# Patient Record
Sex: Female | Born: 1970
Health system: Southern US, Community
[De-identification: ages and names within clinical notes are randomized; demographics above are authoritative.]

## PROBLEM LIST (undated history)

## (undated) DIAGNOSIS — G459 Transient cerebral ischemic attack, unspecified: Secondary | ICD-10-CM

## (undated) DIAGNOSIS — I10 Essential (primary) hypertension: Secondary | ICD-10-CM

## (undated) DIAGNOSIS — D649 Anemia, unspecified: Secondary | ICD-10-CM

## (undated) DIAGNOSIS — N809 Endometriosis, unspecified: Secondary | ICD-10-CM

## (undated) DIAGNOSIS — I639 Cerebral infarction, unspecified: Secondary | ICD-10-CM

## (undated) DIAGNOSIS — G4730 Sleep apnea, unspecified: Secondary | ICD-10-CM

## (undated) HISTORY — PX: ABDOMINAL SURGERY: SHX537

## (undated) HISTORY — PX: BREAST BIOPSY: SHX20

## (undated) HISTORY — PX: TUBAL LIGATION: SHX77

## (undated) HISTORY — PX: BREAST EXCISIONAL BIOPSY: SUR124

---

## 1998-06-20 ENCOUNTER — Other Ambulatory Visit: Admission: RE | Admit: 1998-06-20 | Discharge: 1998-06-20 | Payer: Self-pay | Admitting: Internal Medicine

## 1999-09-05 ENCOUNTER — Encounter: Payer: Self-pay | Admitting: Emergency Medicine

## 1999-09-05 ENCOUNTER — Emergency Department (HOSPITAL_COMMUNITY): Admission: EM | Admit: 1999-09-05 | Discharge: 1999-09-05 | Payer: Self-pay | Admitting: Emergency Medicine

## 1999-11-22 ENCOUNTER — Ambulatory Visit (HOSPITAL_COMMUNITY): Admission: RE | Admit: 1999-11-22 | Discharge: 1999-11-22 | Payer: Self-pay | Admitting: Internal Medicine

## 1999-11-22 ENCOUNTER — Encounter: Payer: Self-pay | Admitting: Internal Medicine

## 2000-02-05 ENCOUNTER — Other Ambulatory Visit: Admission: RE | Admit: 2000-02-05 | Discharge: 2000-02-05 | Payer: Self-pay | Admitting: Internal Medicine

## 2000-12-11 ENCOUNTER — Inpatient Hospital Stay (HOSPITAL_COMMUNITY): Admission: AD | Admit: 2000-12-11 | Discharge: 2000-12-11 | Payer: Self-pay | Admitting: Obstetrics

## 2000-12-11 ENCOUNTER — Encounter: Payer: Self-pay | Admitting: Obstetrics

## 2000-12-14 ENCOUNTER — Inpatient Hospital Stay (HOSPITAL_COMMUNITY): Admission: AD | Admit: 2000-12-14 | Discharge: 2000-12-14 | Payer: Self-pay | Admitting: *Deleted

## 2000-12-17 ENCOUNTER — Ambulatory Visit (HOSPITAL_COMMUNITY): Admission: RE | Admit: 2000-12-17 | Discharge: 2000-12-17 | Payer: Self-pay | Admitting: Obstetrics and Gynecology

## 2000-12-17 ENCOUNTER — Encounter: Payer: Self-pay | Admitting: Obstetrics and Gynecology

## 2001-01-14 ENCOUNTER — Other Ambulatory Visit: Admission: RE | Admit: 2001-01-14 | Discharge: 2001-01-14 | Payer: Self-pay | Admitting: Obstetrics and Gynecology

## 2001-05-11 ENCOUNTER — Inpatient Hospital Stay (HOSPITAL_COMMUNITY): Admission: AD | Admit: 2001-05-11 | Discharge: 2001-05-11 | Payer: Self-pay | Admitting: Obstetrics and Gynecology

## 2001-05-15 ENCOUNTER — Encounter: Payer: Self-pay | Admitting: Obstetrics and Gynecology

## 2001-05-15 ENCOUNTER — Ambulatory Visit (HOSPITAL_COMMUNITY): Admission: RE | Admit: 2001-05-15 | Discharge: 2001-05-15 | Payer: Self-pay | Admitting: Obstetrics and Gynecology

## 2001-06-11 ENCOUNTER — Inpatient Hospital Stay (HOSPITAL_COMMUNITY): Admission: AD | Admit: 2001-06-11 | Discharge: 2001-06-11 | Payer: Self-pay | Admitting: Obstetrics and Gynecology

## 2001-06-12 ENCOUNTER — Inpatient Hospital Stay (HOSPITAL_COMMUNITY): Admission: AD | Admit: 2001-06-12 | Discharge: 2001-06-12 | Payer: Self-pay | Admitting: Obstetrics and Gynecology

## 2001-07-05 ENCOUNTER — Inpatient Hospital Stay (HOSPITAL_COMMUNITY): Admission: AD | Admit: 2001-07-05 | Discharge: 2001-07-05 | Payer: Self-pay | Admitting: Obstetrics and Gynecology

## 2001-08-10 ENCOUNTER — Inpatient Hospital Stay (HOSPITAL_COMMUNITY): Admission: AD | Admit: 2001-08-10 | Discharge: 2001-08-10 | Payer: Self-pay | Admitting: Obstetrics and Gynecology

## 2001-08-12 ENCOUNTER — Inpatient Hospital Stay (HOSPITAL_COMMUNITY): Admission: AD | Admit: 2001-08-12 | Discharge: 2001-08-14 | Payer: Self-pay | Admitting: Obstetrics and Gynecology

## 2001-10-30 ENCOUNTER — Inpatient Hospital Stay (HOSPITAL_COMMUNITY): Admission: AD | Admit: 2001-10-30 | Discharge: 2001-10-30 | Payer: Self-pay | Admitting: Obstetrics and Gynecology

## 2002-03-09 ENCOUNTER — Other Ambulatory Visit: Admission: RE | Admit: 2002-03-09 | Discharge: 2002-03-09 | Payer: Self-pay | Admitting: Obstetrics and Gynecology

## 2003-05-14 ENCOUNTER — Ambulatory Visit (HOSPITAL_BASED_OUTPATIENT_CLINIC_OR_DEPARTMENT_OTHER): Admission: RE | Admit: 2003-05-14 | Discharge: 2003-05-14 | Payer: Self-pay | Admitting: Urology

## 2003-11-24 ENCOUNTER — Other Ambulatory Visit: Admission: RE | Admit: 2003-11-24 | Discharge: 2003-11-24 | Payer: Self-pay | Admitting: Obstetrics and Gynecology

## 2005-06-05 ENCOUNTER — Other Ambulatory Visit: Admission: RE | Admit: 2005-06-05 | Discharge: 2005-06-05 | Payer: Self-pay | Admitting: Obstetrics and Gynecology

## 2005-06-21 ENCOUNTER — Encounter: Admission: RE | Admit: 2005-06-21 | Discharge: 2005-06-21 | Payer: Self-pay | Admitting: Family Medicine

## 2005-08-12 ENCOUNTER — Encounter: Admission: RE | Admit: 2005-08-12 | Discharge: 2005-08-12 | Payer: Self-pay | Admitting: Family Medicine

## 2005-10-25 ENCOUNTER — Emergency Department (HOSPITAL_COMMUNITY): Admission: EM | Admit: 2005-10-25 | Discharge: 2005-10-25 | Payer: Self-pay | Admitting: Emergency Medicine

## 2006-02-05 ENCOUNTER — Other Ambulatory Visit: Admission: RE | Admit: 2006-02-05 | Discharge: 2006-02-05 | Payer: Self-pay | Admitting: Obstetrics and Gynecology

## 2006-06-03 ENCOUNTER — Ambulatory Visit (HOSPITAL_COMMUNITY): Admission: RE | Admit: 2006-06-03 | Discharge: 2006-06-03 | Payer: Self-pay | Admitting: Gastroenterology

## 2007-06-25 ENCOUNTER — Ambulatory Visit (HOSPITAL_COMMUNITY): Admission: RE | Admit: 2007-06-25 | Discharge: 2007-06-25 | Payer: Self-pay | Admitting: Obstetrics and Gynecology

## 2008-08-26 ENCOUNTER — Encounter: Admission: RE | Admit: 2008-08-26 | Discharge: 2008-08-26 | Payer: Self-pay | Admitting: Family Medicine

## 2008-12-14 ENCOUNTER — Inpatient Hospital Stay (HOSPITAL_COMMUNITY): Admission: AD | Admit: 2008-12-14 | Discharge: 2008-12-14 | Payer: Self-pay | Admitting: Obstetrics and Gynecology

## 2009-05-08 ENCOUNTER — Inpatient Hospital Stay (HOSPITAL_COMMUNITY): Admission: AD | Admit: 2009-05-08 | Discharge: 2009-05-08 | Payer: Self-pay | Admitting: Obstetrics and Gynecology

## 2009-05-08 ENCOUNTER — Inpatient Hospital Stay (HOSPITAL_COMMUNITY): Admission: AD | Admit: 2009-05-08 | Discharge: 2009-05-10 | Payer: Self-pay | Admitting: Obstetrics and Gynecology

## 2010-03-21 ENCOUNTER — Ambulatory Visit (HOSPITAL_COMMUNITY)
Admission: RE | Admit: 2010-03-21 | Discharge: 2010-03-21 | Payer: Self-pay | Source: Home / Self Care | Attending: Obstetrics and Gynecology | Admitting: Obstetrics and Gynecology

## 2010-04-22 ENCOUNTER — Encounter: Payer: Self-pay | Admitting: Obstetrics and Gynecology

## 2010-06-12 LAB — BASIC METABOLIC PANEL
CO2: 24 mEq/L (ref 19–32)
Chloride: 107 mEq/L (ref 96–112)
GFR calc Af Amer: >60 mL/min (ref >60)
Potassium: 4 mEq/L (ref 3.5–5.1)
Sodium: 137 mEq/L (ref 135–145)

## 2010-06-12 LAB — CBC
Hemoglobin: 12.9 g/dL (ref 12.0–15.0)
MCH: 27 pg (ref 26.0–34.0)
MCV: 78.6 fL (ref 78.0–100.0)

## 2010-06-12 LAB — SURGICAL PCR SCREEN: Staphylococcus aureus: NEGATIVE

## 2010-06-21 LAB — CBC
HCT: 37.1 % (ref 36.0–46.0)
HCT: 37.7 % (ref 36.0–46.0)
Hemoglobin: 12.6 g/dL (ref 12.0–15.0)
MCHC: 32.9 g/dL (ref 30.0–36.0)
MCHC: 33.4 g/dL (ref 30.0–36.0)
MCV: 82.9 fL (ref 78.0–100.0)
Platelets: 176 10*3/uL (ref 150–400)
RDW: 16.5 % — ABNORMAL HIGH (ref 11.5–15.5)

## 2010-06-21 LAB — RPR: RPR Ser Ql: NONREACTIVE

## 2010-07-07 LAB — WET PREP, GENITAL
Clue Cells Wet Prep HPF POC: NONE SEEN
Trich, Wet Prep: NONE SEEN
Yeast Wet Prep HPF POC: NONE SEEN

## 2010-07-07 LAB — URINALYSIS, ROUTINE W REFLEX MICROSCOPIC
Bilirubin Urine: NEGATIVE
Glucose, UA: NEGATIVE mg/dL
Hgb urine dipstick: NEGATIVE
Urobilinogen, UA: 0.2 mg/dL (ref 0.0–1.0)

## 2010-08-15 NOTE — Op Note (Signed)
[name], [name] NO.:  [id number]   MEDICAL RECORD NO.:  [id number]          PATIENT TYPE:  AMB   LOCATION:  SDC                           FACILITY:  [facility]   PHYSICIAN:  Miguel Aschoff, M.D.       DATE OF BIRTH:  1970-09-08   DATE OF PROCEDURE:  06/25/2007  DATE OF DISCHARGE:                               OPERATIVE REPORT   PREOPERATIVE DIAGNOSIS:  Pelvic pain.   Dictation ended at this point.      Miguel Aschoff, M.D.  Electronically Signed     AR/MEDQ  D:  06/25/2007  T:  06/25/2007  Job:  [id number]

## 2010-08-15 NOTE — Op Note (Signed)
NAMECONCEPTION, DOEBLER NO.:  [id number]   MEDICAL RECORD NO.:  [id number]          PATIENT TYPE:  AMB   LOCATION:  SDC                           FACILITY:  [facility]   PHYSICIAN:  Miguel Aschoff, M.D.       DATE OF BIRTH:  [birth year]   DATE OF PROCEDURE:  06/25/2007  DATE OF DISCHARGE:                               OPERATIVE REPORT   PREOPERATIVE DIAGNOSIS:  Chronic pelvic pain.   POSTOPERATIVE DIAGNOSIS:  Pelvic adhesions, pelvic endometriosis.   SURGEON:  Miguel Aschoff, MD   ASSISTANT:  Luvenia Redden, MD   ANESTHESIA:  General.   COMPLICATIONS:  None.   JUSTIFICATION:  The patient is a 40 year old black female with a history  of pelvic pain, more noticeable on the right.  The patient has undergone  medical evaluation and treatment with no improvement of the pain.  She  presents now to undergo evaluation with laparoscopy to see if the source  of the pain can be established and corrected.  The risks and benefits of  the procedure were discussed with the patient.  Informed consent has  been obtained.   PROCEDURE IN DETAIL:  The patient was taken to the operating room and  placed in the supine position.  General anesthesia was administered  without difficulty.  She was then placed in the dorsal lithotomy  position, prepped and draped in the usual sterile fashion.  Once this  was done the bladder was catheterized.  Speculum was placed in the  vaginal vault.  Hulka tenaculum was placed through the cervix and then  attention was directed to the umbilicus where a small infraumbilical  incision was made.  A Veress needle was inserted and then the abdomen  was insufflated with 3 liters of CO2.  Following the insufflation the  trocar laparoscope was placed followed by the laparoscope itself.  At  this point two 5 mm ports were established in the right and left lower  quadrants and then systematic inspection of pelvic organs was carried  out.  The anterior peritoneum was  unremarkable.  The round ligaments  were inspected and traced out to their exits from the abdomen.  There  were no signs of any inguinal hernias.  The right tube was visualized  along its course and was completely normal.  There were no peritubal  adhesions.  The right ovary appeared to be completely within normal  limits, again without adhesions.  On the left side the left tube was  completely within normal limits.  However, the left ovary was noted to  be bound to the appendices epiploica of the sigmoid colon.  In the cul-  de-sac nonpigmented lesions were noted between the right and left  uterosacral ligaments which appeared to be consistent with nonpigmented  endometriosis.  The intestinal surfaces were inspected and were noted to  be within normal limits.  The appendix was visualized and was also noted  to be within normal limits.  The liver was inspected and no  abnormalities were noted on the surface of the liver.   At this point using laparoscopic  scissors the adhesions holding the left  ovary to the appendices epiploica were taken down sharply and any  bleeding points cauterized with excellent hemostasis.  It was possible  to free the entire posterior surface of the ovary off the appendices  epiploica and restore the anatomy to a more normal configuration.  Attention was then directed to the implants noted in the cul-de-sac.  These were touched with bipolar cautery to ablate these implants.  With  no other abnormalities being noted it was elected to complete the  procedure.  The area where the adhesions were taken off the left ovary  was then covered in Interceed in an effort to prevent reformation of  these adhesions.  Once this was done the CO2 was allowed to escape.  All  instruments were removed and small incisions were closed using  subcuticular 4-0 Vicryl.  Port sites were then injected with a total of  10 mL of quarter percent Marcaine.  Blood loss was minimal.  At this   point all instruments were removed.  The patient was taken out of the  lithotomy position and then reversed from the anesthetic and brought to  the recovery room in satisfactory condition.   PLAN:  Is for the patient to be discharged home.   HOME MEDICATIONS:  1. Include Tylox 1-3 hours as needed for pain.  2. Doxycycline one twice a day x3 days.   FOLLOWUP:  The patient will be seen back in 4 weeks for followup  examination.  She is to call if there are any problems such as fever,  pain or heavy bleeding.  She is to call on March 27 to discuss the  intraoperative findings.      Miguel Aschoff, M.D.  Electronically Signed     AR/MEDQ  D:  06/25/2007  T:  06/25/2007  Job:  [id number]
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
## 2010-08-18 NOTE — Op Note (Signed)
NAME:  Amy [name], Amy [name]                        ACCOUNT NO.:  [id number]   MEDICAL RECORD NO.:  [id number]                   PATIENT TYPE:  AMB   LOCATION:  NESC                                 FACILITY:  [facility]   PHYSICIAN:  Lindaann Slough, M.D.               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   POSTOPERATIVE DIAGNOSES:  Meatal stenosis and interstitial cystitis.   PROCEDURE:  Cystoscopy, hydraulic bladder distention and urethral dilation.   SURGEON:  Lindaann Slough, M.D.   ANESTHESIA:  General.   INDICATIONS FOR PROCEDURE:  The patient is a 40 year old female who has been  complaining of frequency, nocturia x 3-4.  She was treated with Bactrim and  urelle. She also has a history of recurrent urinary tract infections and she  continues to complain of the same symptoms. She is scheduled today for  cystoscopy.   Under general anesthesia, the patient was prepped and draped and placed in  the dorsal lithotomy position.  A #23 Wappler cystoscope could not be passed  in the bladder because of meatal stenosis.  The urethra was then dilated  with female sounds up to #30 [place]. Then the cystoscope was passed in the  bladder.  The bladder mucosa is normal, there is no stone or tumor in the  bladder.  The ureteral orifices are in normal position and shape with clear  efflux.  The bladder was then emptied and there was evidence of submucosal  hemorrhage after filling up the bladder again.  The bladder capacity is  about 600 mL.  Then a mixture of 15 mL of 0.5% Marcaine and 400 mg of  Pyridium was instilled in the bladder.   The cystoscope was then removed.  The patient tolerated the procedure well  and left the OR in satisfactory condition to post anesthesia care unit.                                               Lindaann Slough,  M.D.    MN/MEDQ  D:  05/14/2003  T:  05/14/2003  Job:  [id number]   cc:   Naima A. Normand Sloop, M.D.  [address], [address]  [place]  [place] [address]  Fax: [phone]

## 2010-12-25 LAB — PROTIME-INR
INR: 1.1
Prothrombin Time: 14.1

## 2010-12-25 LAB — CBC
Platelets: 370
RBC: 4.7
RDW: 14
WBC: 6.1

## 2010-12-25 LAB — APTT: aPTT: 33

## 2010-12-25 LAB — URINALYSIS, ROUTINE W REFLEX MICROSCOPIC
Glucose, UA: NEGATIVE
Hgb urine dipstick: NEGATIVE
Nitrite: NEGATIVE
Specific Gravity, Urine: 1.02

## 2010-12-25 LAB — PREGNANCY, URINE: Preg Test, Ur: NEGATIVE

## 2012-08-08 ENCOUNTER — Encounter (HOSPITAL_BASED_OUTPATIENT_CLINIC_OR_DEPARTMENT_OTHER): Payer: Self-pay | Admitting: *Deleted

## 2012-08-08 ENCOUNTER — Emergency Department (HOSPITAL_BASED_OUTPATIENT_CLINIC_OR_DEPARTMENT_OTHER): Payer: Self-pay

## 2012-08-08 ENCOUNTER — Emergency Department (HOSPITAL_BASED_OUTPATIENT_CLINIC_OR_DEPARTMENT_OTHER)
Admission: EM | Admit: 2012-08-08 | Discharge: 2012-08-08 | Disposition: A | Discharge status: Other Acute Inpatient Hospital | Payer: Self-pay | Attending: Emergency Medicine | Admitting: Emergency Medicine

## 2012-08-08 ENCOUNTER — Other Ambulatory Visit: Payer: Self-pay

## 2012-08-08 DIAGNOSIS — I10 Essential (primary) hypertension: Secondary | ICD-10-CM | POA: Insufficient documentation

## 2012-08-08 DIAGNOSIS — R5381 Other malaise: Secondary | ICD-10-CM | POA: Insufficient documentation

## 2012-08-08 DIAGNOSIS — M79609 Pain in unspecified limb: Secondary | ICD-10-CM | POA: Insufficient documentation

## 2012-08-08 DIAGNOSIS — Z8742 Personal history of other diseases of the female genital tract: Secondary | ICD-10-CM | POA: Insufficient documentation

## 2012-08-08 DIAGNOSIS — M25559 Pain in unspecified hip: Secondary | ICD-10-CM | POA: Insufficient documentation

## 2012-08-08 DIAGNOSIS — R079 Chest pain, unspecified: Secondary | ICD-10-CM | POA: Insufficient documentation

## 2012-08-08 DIAGNOSIS — M25519 Pain in unspecified shoulder: Secondary | ICD-10-CM | POA: Insufficient documentation

## 2012-08-08 HISTORY — DX: Endometriosis, unspecified: N80.9

## 2012-08-08 LAB — CBC WITH DIFFERENTIAL/PLATELET
Eosinophils Absolute: 0.5 10*3/uL (ref 0.0–0.7)
Eosinophils Relative: 6 % — ABNORMAL HIGH (ref 0–5)
Lymphs Abs: 2.8 10*3/uL (ref 0.7–4.0)
MCH: 26.1 pg (ref 26.0–34.0)
MCHC: 34.7 g/dL (ref 30.0–36.0)
MCV: 75.2 fL — ABNORMAL LOW (ref 78.0–100.0)
Monocytes Absolute: 0.5 10*3/uL (ref 0.1–1.0)
Neutrophils Relative %: 52 % (ref 43–77)
Platelets: 236 10*3/uL (ref 150–400)
RBC: 4.83 MIL/uL (ref 3.87–5.11)
RDW: 14.5 % (ref 11.5–15.5)

## 2012-08-08 LAB — BASIC METABOLIC PANEL
Calcium: 9.4 mg/dL (ref 8.4–10.5)
GFR calc Af Amer: >90 mL/min (ref >90)
GFR calc non Af Amer: 79 mL/min — ABNORMAL LOW (ref >90)
Glucose, Bld: 86 mg/dL (ref 70–99)
Sodium: 138 mEq/L (ref 135–145)

## 2012-08-08 LAB — TROPONIN I: Troponin I: <0.3 ng/mL (ref <0.30)

## 2012-08-08 MED ORDER — ASPIRIN 81 MG PO CHEW
324.0000 mg | CHEWABLE_TABLET | Freq: Once | ORAL | Status: AC
  Administered 2012-08-08: 324 mg via ORAL
  Filled 2012-08-08: qty 4

## 2012-08-08 MED ORDER — NITROGLYCERIN 2 % TD OINT
1.0000 [in_us] | TOPICAL_OINTMENT | Freq: Once | TRANSDERMAL | Status: AC
  Administered 2012-08-08: 1 [in_us] via TOPICAL
  Filled 2012-08-08: qty 1

## 2012-08-08 MED ORDER — NITROGLYCERIN 0.4 MG SL SUBL
0.4000 mg | SUBLINGUAL_TABLET | SUBLINGUAL | Status: DC | PRN
  Filled 2012-08-08: qty 25

## 2012-08-08 MED ORDER — ACETAMINOPHEN 325 MG PO TABS
650.0000 mg | ORAL_TABLET | Freq: Once | ORAL | Status: AC
  Administered 2012-08-08: 650 mg via ORAL
  Filled 2012-08-08: qty 2

## 2012-08-08 NOTE — ED Provider Notes (Signed)
History     CSN: [id number]  Arrival date & time 08/08/12  1919   First MD Initiated Contact with Patient 08/08/12 1933      Chief Complaint  Patient presents with  . Numbness    (Consider location/radiation/quality/duration/timing/severity/associated sxs/prior treatment) HPI Comments: Patient presents to the ER with complaints of elevated blood pressure and left arm pain. Patient reports that she has not been feeling like herself all day today. She has been having a heaviness across the shoulders and the pain in her left shoulder and arm. She reports some tingling in the left fingertips, but that has been present for more than a week. She had her blood pressure checked and it was 160/100. She reports that she has never had elevated blood pressure. Patient is not experiencing any shortness of breath.  She does report that when she woke this morning she had some pain and numbness in the right hip and leg region.   Past Medical History  Diagnosis Date  . Endometriosis     Past Surgical History  Procedure Laterality Date  . Tubal ligation      History reviewed. No pertinent family history.  History  Substance Use Topics  . Smoking status: Never Smoker   . Smokeless tobacco: Not on file  . Alcohol Use: No    OB History   Grav Para Term Preterm Abortions TAB SAB Ect Mult Living                  Review of Systems  Constitutional: Positive for fatigue.  Respiratory: Negative for shortness of breath.   Cardiovascular: Positive for chest pain.  All other systems reviewed and are negative.    Allergies  Codeine and Shellfish allergy  Home Medications   Current Outpatient Rx  Name  Route  Sig  Dispense  Refill  . cetirizine (ZYRTEC) 10 MG tablet   Oral   Take 10 mg by mouth daily.           BP 188/104  Pulse 85  Temp(Src) 99.6 F (37.6 C)  Resp 16  Ht 5\' 4"  (1.626 m)  Wt 193 lb (87.544 kg)  BMI 33.11 kg/m2  SpO2 100%  LMP 07/29/2012  Physical Exam   Constitutional: She is oriented to person, place, and time. She appears well-developed and well-nourished. No distress.  HENT:  Head: Normocephalic and atraumatic.  Right Ear: Hearing normal.  Left Ear: Hearing normal.  Nose: Nose normal.  Mouth/Throat: Oropharynx is clear and moist and mucous membranes are normal.  Eyes: Conjunctivae and EOM are normal. Pupils are equal, round, and reactive to light.  Neck: Normal range of motion. Neck supple.  Cardiovascular: Regular rhythm, S1 normal and S2 normal.  Exam reveals no gallop and no friction rub.   No murmur heard. Pulmonary/Chest: Effort normal and breath sounds normal. No respiratory distress. She exhibits no tenderness.  Abdominal: Soft. Normal appearance and bowel sounds are normal. There is no hepatosplenomegaly. There is no tenderness. There is no rebound, no guarding, no tenderness at McBurney's point and negative Murphy's sign. No hernia.  Musculoskeletal: Normal range of motion.  Neurological: She is alert and oriented to person, place, and time. She has normal strength. No cranial nerve deficit or sensory deficit. Coordination normal. GCS eye subscore is 4. GCS verbal subscore is 5. GCS motor subscore is 6.  Skin: Skin is warm, dry and intact. No rash noted. No cyanosis.  Psychiatric: She has a normal mood and affect. Her speech is  normal and behavior is normal. Thought content normal.    ED Course  Procedures (including critical care time)  EKG:  Date: 08/08/2012  Rate: 82  Rhythm: normal sinus rhythm  QRS Axis: normal  Intervals: normal  ST/T Wave abnormalities: nonspecific ST/T changes  Conduction Disutrbances:none  Narrative Interpretation:   Old EKG Reviewed: none available    Labs Reviewed  CBC WITH DIFFERENTIAL - Abnormal; Notable for the following:    MCV 75.2 (*)    Eosinophils Relative 6 (*)    All other components within normal limits  BASIC METABOLIC PANEL - Abnormal; Notable for the following:     Potassium 3.3 (*)    GFR calc non Af Amer 79 (*)    All other components within normal limits  PRO B NATRIURETIC PEPTIDE  TROPONIN I   Dg Chest 2 View  08/08/2012  *RADIOLOGY REPORT*  Clinical Data: Chest pain  CHEST - 2 VIEW  Comparison: None  Findings: The cardiomediastinal silhouette is unremarkable. There is no evidence of focal airspace disease, pulmonary edema, suspicious pulmonary nodule/mass, pleural effusion, or pneumothorax. No acute bony abnormalities are identified.  IMPRESSION: No evidence of active cardiopulmonary disease.   Original Report Authenticated By: Harmon Pier, M.D.    Ct Head Wo Contrast  08/08/2012  *RADIOLOGY REPORT*  Clinical Data: Left hand numbness  CT HEAD WITHOUT CONTRAST  Technique:  Contiguous axial images were obtained from the base of the skull through the vertex without contrast.  Comparison: 08/26/2008  Findings: Evidence of left antrectomy with left maxillary wall sclerosis and mucoperiosteal thickening.  Ethmoid sinusitis also noted.  No skull fracture. No acute hemorrhage, acute infarction, or mass lesion is seen.  IMPRESSION: No acute intracranial finding.   Original Report Authenticated By: Christiana Pellant, M.D.      Diagnosis: 1. Uncontrolled hypertension 2. Chest pain    MDM  She comes to the ER for evaluation of elevated blood pressure. Patient has been having some pain across her shoulder base and a heaviness in the left shoulder region as well. This started earlier today she noticed she was very hypertensive, came to the ER. Patient has blood pressure 186/111 on arrival. She has no previous history of hypertension. EKG has nonspecific changes with some subtle T wave inversions and possible depressions in lead 3 and aVF. Patient's pain has resolved and her blood pressure is much improved after nitroglycerin.  Patient's BNP troponin were normal. The remainder of her workup was essentially normal except for slight hypokalemia. X-ray did not show any  abnormality. Because the patient has had left arm numbness and tingling and also had some tingling in her right leg, head CT was performed. No abnormalities were seen. This will require further workup.  I recommended observation in the hospital overnight for control of the blood pressure and serial troponins. Patient is in agreement with the plan. She would like to go to [facility]. As with Dr. Blinda Leatherwood, on call for hospitalist service. He is accepting the patient.        Gilda Crease, MD 08/08/12 [phone]

## 2012-08-08 NOTE — ED Notes (Signed)
Patient transported to X-ray 

## 2012-08-08 NOTE — ED Notes (Signed)
Assigned to bed 702 @ [facility] per nursing supervisor Sunday Shams, RN notified, [facility] called for transport.

## 2012-08-08 NOTE — ED Notes (Signed)
[facility] has been notified. 

## 2012-08-08 NOTE — ED Notes (Signed)
Pt reports left arm and right leg  numbness x 7 hrs ago with increased bp

## 2013-04-19 ENCOUNTER — Encounter (HOSPITAL_COMMUNITY): Payer: Self-pay | Admitting: Emergency Medicine

## 2013-04-19 ENCOUNTER — Emergency Department (HOSPITAL_COMMUNITY)
Admission: EM | Admit: 2013-04-19 | Discharge: 2013-04-19 | Disposition: A | Discharge status: Home / Self Care | Payer: BC Managed Care – PPO | Attending: Emergency Medicine | Admitting: Emergency Medicine

## 2013-04-19 DIAGNOSIS — R42 Dizziness and giddiness: Secondary | ICD-10-CM

## 2013-04-19 DIAGNOSIS — Z79899 Other long term (current) drug therapy: Secondary | ICD-10-CM | POA: Insufficient documentation

## 2013-04-19 DIAGNOSIS — Z8742 Personal history of other diseases of the female genital tract: Secondary | ICD-10-CM | POA: Insufficient documentation

## 2013-04-19 DIAGNOSIS — H9209 Otalgia, unspecified ear: Secondary | ICD-10-CM | POA: Insufficient documentation

## 2013-04-19 LAB — POCT I-STAT, CHEM 8
BUN: 7 mg/dL (ref 6–23)
CHLORIDE: 100 meq/L (ref 96–112)
Calcium, Ion: 1.18 mmol/L (ref 1.12–1.23)
Creatinine, Ser: 1 mg/dL (ref 0.50–1.10)
GLUCOSE: 100 mg/dL — AB (ref 70–99)
HEMATOCRIT: 41 % (ref 36.0–46.0)
Hemoglobin: 13.9 g/dL (ref 12.0–15.0)
POTASSIUM: 3.6 meq/L — AB (ref 3.7–5.3)
SODIUM: 140 meq/L (ref 137–147)
TCO2: 27 mmol/L (ref 0–100)

## 2013-04-19 MED ORDER — SODIUM CHLORIDE 0.9 % IV BOLUS (SEPSIS)
1000.0000 mL | Freq: Once | INTRAVENOUS | Status: AC
  Administered 2013-04-19: 1000 mL via INTRAVENOUS

## 2013-04-19 MED ORDER — MECLIZINE HCL 25 MG PO TABS: 25.0000 mg | ORAL_TABLET | Freq: Three times a day (TID) | ORAL | Status: DC | PRN

## 2013-04-19 MED ORDER — ONDANSETRON HCL 4 MG/2ML IJ SOLN
4.0000 mg | Freq: Once | INTRAMUSCULAR | Status: AC
  Administered 2013-04-19: 4 mg via INTRAVENOUS
  Filled 2013-04-19: qty 2

## 2013-04-19 MED ORDER — MECLIZINE HCL 25 MG PO TABS
25.0000 mg | ORAL_TABLET | Freq: Once | ORAL | Status: AC
  Administered 2013-04-19: 25 mg via ORAL
  Filled 2013-04-19: qty 1

## 2013-04-19 NOTE — ED Provider Notes (Signed)
CSN: [id number]     Arrival date & time 04/19/13  1303 History   First MD Initiated Contact with Patient 04/19/13 1516     Chief Complaint  Patient presents with  . Nausea  . Emesis   (Consider location/radiation/quality/duration/timing/severity/associated sxs/prior Treatment) HPI Comments: Patient is a 43 year old female with history of hypertension who presents today for nausea, vomiting, and dizziness. This began yesterday, but worsened today when she woke up. She feels as though the room is spinning around her. Any movement she makes makes this sensation worse. Laying back in the bed with her eyes closed alleviates her symptoms. She has never had these symptoms in the past. Last week she had her BP meds adjusted. Her lisinopril dose was increased and HCTZ was added to her regimen. She denies fever, chills, abdominal pain, double vision, blurry vision, photophobia.   Patient is a 43 y.o. female presenting with vomiting. The history is provided by the patient. No language interpreter was used.  Emesis Associated symptoms: no abdominal pain and no chills     Past Medical History  Diagnosis Date  . Endometriosis    Past Surgical History  Procedure Laterality Date  . Tubal ligation     History reviewed. No pertinent family history. History  Substance Use Topics  . Smoking status: Never Smoker   . Smokeless tobacco: Not on file  . Alcohol Use: No   OB History   Grav Para Term Preterm Abortions TAB SAB Ect Mult Living                 Review of Systems  Constitutional: Negative for fever and chills.  HENT: Positive for ear pain.   Respiratory: Negative for shortness of breath.   Cardiovascular: Negative for chest pain.  Gastrointestinal: Positive for nausea and vomiting. Negative for abdominal pain.  Neurological: Positive for dizziness.  All other systems reviewed and are negative.    Allergies  Codeine; Iodine; and Shellfish allergy  Home Medications   Current  Outpatient Rx  Name  Route  Sig  Dispense  Refill  . lisinopril-hydrochlorothiazide (PRINZIDE,ZESTORETIC) 20-12.5 MG per tablet   Oral   Take 1 tablet by mouth daily.          BP 122/74  Pulse 84  Temp(Src) 97.3 F (36.3 C) (Oral)  Resp 16  SpO2 100% Physical Exam  Nursing note and vitals reviewed. Constitutional: She is oriented to person, place, and time. She appears well-developed and well-nourished. She does not appear ill. No distress.  Pt clearly uncomfortable changing positions  HENT:  Head: Normocephalic and atraumatic.  Right Ear: External ear and ear canal normal. No mastoid tenderness. Tympanic membrane is retracted.  Left Ear: Tympanic membrane, external ear and ear canal normal. No mastoid tenderness. Tympanic membrane is not retracted.  Nose: Nose normal.  Mouth/Throat: Uvula is midline and oropharynx is clear and moist. Mucous membranes are dry.  Eyes: Conjunctivae and EOM are normal. Pupils are equal, round, and reactive to light.  Neck: Normal range of motion. No spinous process tenderness and no muscular tenderness present.  Cardiovascular: Normal rate, regular rhythm, normal heart sounds, intact distal pulses and normal pulses.   Pulmonary/Chest: Effort normal and breath sounds normal. No stridor. No respiratory distress. She has no wheezes. She has no rales.  Abdominal: Soft. She exhibits no distension. There is no tenderness.  Musculoskeletal: Normal range of motion.  Neurological: She is alert and oriented to person, place, and time. She has normal strength. No  sensory deficit.  Finger nose finger normal. No pronator drift. Grip strength 5/5 bilaterally   Skin: Skin is warm and dry. She is not diaphoretic. No erythema.  Psychiatric: She has a normal mood and affect. Her behavior is normal.    ED Course  Procedures (including critical care time) Labs Review Labs Reviewed  POCT I-STAT, CHEM 8 - Abnormal; Notable for the following:    Potassium 3.6 (*)     Glucose, Bld 100 (*)    All other components within normal limits   Imaging Review No results found.  EKG Interpretation   None       MDM   1. Vertigo    Patient presents to ED with sx consistent with vertigo. Pt feels significantly improved after fluids and antivert. Neuro exam without focal deficit. Gait is not ataxic. Labs are unremarkable. Patient will follow up with her PCP. Reasons to return to the emergency department immediately were given. Vital signs stable for discharge. Patient / Family / Caregiver informed of clinical course, understand medical decision-making process, and agree with plan.     Mora Bellman, PA-C 04/20/13 1904

## 2013-04-19 NOTE — Discharge Instructions (Signed)
Benign Positional Vertigo °Vertigo means you feel like you or your surroundings are moving when they are not. Benign positional vertigo is the most common form of vertigo. Benign means that the cause of your condition is not serious. Benign positional vertigo is more common in older adults. °CAUSES  °Benign positional vertigo is the result of an upset in the labyrinth system. This is an area in the middle ear that helps control your balance. This may be caused by a viral infection, head injury, or repetitive motion. However, often no specific cause is found. °SYMPTOMS  °Symptoms of benign positional vertigo occur when you move your head or eyes in different directions. Some of the symptoms may include: °· Loss of balance and falls. °· Vomiting. °· Blurred vision. °· Dizziness. °· Nausea. °· Involuntary eye movements (nystagmus). °DIAGNOSIS  °Benign positional vertigo is usually diagnosed by physical exam. If the specific cause of your benign positional vertigo is unknown, your caregiver may perform imaging tests, such as magnetic resonance imaging (MRI) or computed tomography (CT). °TREATMENT  °Your caregiver may recommend movements or procedures to correct the benign positional vertigo. Medicines such as meclizine, benzodiazepines, and medicines for nausea may be used to treat your symptoms. In rare cases, if your symptoms are caused by certain conditions that affect the inner ear, you may need surgery. °HOME CARE INSTRUCTIONS  °· Follow your caregiver's instructions. °· Move slowly. Do not make sudden body or head movements. °· Avoid driving. °· Avoid operating heavy machinery. °· Avoid performing any tasks that would be dangerous to you or others during a vertigo episode. °· Drink enough fluids to keep your urine clear or pale yellow. °SEEK IMMEDIATE MEDICAL CARE IF:  °· You develop problems with walking, weakness, numbness, or using your arms, hands, or legs. °· You have difficulty speaking. °· You develop  severe headaches. °· Your nausea or vomiting continues or gets worse. °· You develop visual changes. °· Your family or friends notice any behavioral changes. °· Your condition gets worse. °· You have a fever. °· You develop a stiff neck or sensitivity to light. °MAKE SURE YOU:  °· Understand these instructions. °· Will watch your condition. °· Will get help right away if you are not doing well or get worse. °Document Released: 12/25/2005 Document Revised: 06/11/2011 Document Reviewed: 12/07/2010 °[employer]® Patient Information ©2014 [employer]. ° °Dizziness °Dizziness is a common problem. It is a feeling of unsteadiness or lightheadedness. You may feel like you are about to faint. Dizziness can lead to injury if you stumble or fall. A person of any age group can suffer from dizziness, but dizziness is more common in older adults. °CAUSES  °Dizziness can be caused by many different things, including: °· Middle ear problems. °· Standing for too long. °· Infections. °· An allergic reaction. °· Aging. °· An emotional response to something, such as the sight of blood. °· Side effects of medicines. °· Fatigue. °· Problems with circulation or blood pressure. °· Excess use of alcohol, medicines, or illegal drug use. °· Breathing too fast (hyperventilation). °· An arrhythmia or problems with your heart rhythm. °· Low red blood cell count (anemia). °· Pregnancy. °· Vomiting, diarrhea, fever, or other illnesses that cause dehydration. °· Diseases or conditions such as Parkinson's disease, high blood pressure (hypertension), diabetes, and thyroid problems. °· Exposure to extreme heat. °DIAGNOSIS  °To find the cause of your dizziness, your caregiver may do a physical exam, lab tests, radiologic imaging scans, or an electrocardiography test (ECG).  °  TREATMENT  °Treatment of dizziness depends on the cause of your symptoms and can vary greatly. °HOME CARE INSTRUCTIONS  °· Drink enough fluids to keep your urine clear or pale  yellow. This is especially important in very hot weather. In the elderly, it is also important in cold weather. °· If your dizziness is caused by medicines, take them exactly as directed. When taking blood pressure medicines, it is especially important to get up slowly. °· Rise slowly from chairs and steady yourself until you feel okay. °· In the morning, first sit up on the side of the bed. When this seems okay, stand slowly while holding onto something until you know your balance is fine. °· If you need to stand in one place for a long time, be sure to move your legs often. Tighten and relax the muscles in your legs while standing. °· If dizziness continues to be a problem, have someone stay with you for a day or two. Do this until you feel you are well enough to stay alone. Have the person call your caregiver if he or she notices changes in you that are concerning. °· Do not drive or use heavy machinery if you feel dizzy. °· Do not drink alcohol. °SEEK IMMEDIATE MEDICAL CARE IF:  °· Your dizziness or lightheadedness gets worse. °· You feel nauseous or vomit. °· You develop problems with talking, walking, weakness, or using your arms, hands, or legs. °· You are not thinking clearly or you have difficulty forming sentences. It may take a friend or family member to determine if your thinking is normal. °· You develop chest pain, abdominal pain, shortness of breath, or sweating. °· Your vision changes. °· You notice any bleeding. °· You have side effects from medicine that seems to be getting worse rather than better. °MAKE SURE YOU:  °· Understand these instructions. °· Will watch your condition. °· Will get help right away if you are not doing well or get worse. °Document Released: 09/12/2000 Document Revised: 06/11/2011 Document Reviewed: 10/06/2010 °[employer]Care® Patient Information ©2014 [employer]. ° °

## 2013-04-19 NOTE — ED Notes (Addendum)
Pt reports N/V since this morning, dizzy and lightheaded when standing. Pt has HTN meds changed on Friday. Pt denies pain.

## 2013-04-22 NOTE — ED Provider Notes (Signed)
Medical screening examination/treatment/procedure(s) were performed by non-physician practitioner and as supervising physician I was immediately available for consultation/collaboration.  EKG Interpretation   None         Audree CamelScott T Zyair Rhein, MD 04/22/13 1317

## 2015-01-24 ENCOUNTER — Emergency Department (HOSPITAL_BASED_OUTPATIENT_CLINIC_OR_DEPARTMENT_OTHER): Payer: Self-pay

## 2015-01-24 ENCOUNTER — Encounter (HOSPITAL_BASED_OUTPATIENT_CLINIC_OR_DEPARTMENT_OTHER): Payer: Self-pay | Admitting: *Deleted

## 2015-01-24 ENCOUNTER — Emergency Department (HOSPITAL_BASED_OUTPATIENT_CLINIC_OR_DEPARTMENT_OTHER)
Admission: EM | Admit: 2015-01-24 | Discharge: 2015-01-24 | Disposition: A | Discharge status: Home / Self Care | Payer: Self-pay | Attending: Emergency Medicine | Admitting: Emergency Medicine

## 2015-01-24 DIAGNOSIS — Z79899 Other long term (current) drug therapy: Secondary | ICD-10-CM | POA: Insufficient documentation

## 2015-01-24 DIAGNOSIS — Z87448 Personal history of other diseases of urinary system: Secondary | ICD-10-CM | POA: Insufficient documentation

## 2015-01-24 DIAGNOSIS — J209 Acute bronchitis, unspecified: Secondary | ICD-10-CM | POA: Insufficient documentation

## 2015-01-24 DIAGNOSIS — Z3202 Encounter for pregnancy test, result negative: Secondary | ICD-10-CM | POA: Insufficient documentation

## 2015-01-24 LAB — PREGNANCY, URINE: Preg Test, Ur: NEGATIVE

## 2015-01-24 LAB — URINALYSIS, ROUTINE W REFLEX MICROSCOPIC
BILIRUBIN URINE: NEGATIVE
GLUCOSE, UA: NEGATIVE mg/dL
Hgb urine dipstick: NEGATIVE
KETONES UR: NEGATIVE mg/dL
LEUKOCYTES UA: NEGATIVE
Nitrite: NEGATIVE
PH: 6.5 (ref 5.0–8.0)
Protein, ur: NEGATIVE mg/dL
SPECIFIC GRAVITY, URINE: 1.019 (ref 1.005–1.030)
Urobilinogen, UA: 1 mg/dL (ref 0.0–1.0)

## 2015-01-24 MED ORDER — IPRATROPIUM-ALBUTEROL 0.5-2.5 (3) MG/3ML IN SOLN
3.0000 mL | RESPIRATORY_TRACT | Status: DC
  Administered 2015-01-24: 3 mL via RESPIRATORY_TRACT
  Filled 2015-01-24: qty 3

## 2015-01-24 MED ORDER — DEXAMETHASONE SODIUM PHOSPHATE 10 MG/ML IJ SOLN
10.0000 mg | Freq: Once | INTRAMUSCULAR | Status: AC
  Administered 2015-01-24: 10 mg via INTRAMUSCULAR

## 2015-01-24 MED ORDER — FLUCONAZOLE 150 MG PO TABS: ORAL_TABLET | ORAL | Status: DC

## 2015-01-24 MED ORDER — AZITHROMYCIN 250 MG PO TABS: ORAL_TABLET | ORAL | Status: DC

## 2015-01-24 MED ORDER — DEXAMETHASONE SODIUM PHOSPHATE 10 MG/ML IJ SOLN
INTRAMUSCULAR | Status: AC
  Filled 2015-01-24: qty 1

## 2015-01-24 NOTE — ED Provider Notes (Signed)
CSN: [id number]     Arrival date & time 01/24/15  0015 History  By signing my name below, I, Ronney Lion, attest that this documentation has been prepared under the direction and in the presence of Paula Libra, MD. Electronically Signed: Ronney Lion, ED Scribe. 01/24/2015. 12:42 AM.  Chief Complaint  Patient presents with  . Cough   The history is provided by the patient. No language interpreter was used.   HPI Comments: Amy [name] is a 44 y.o. female who presents to the Emergency Department complaining of a chronic cough that began 2 years ago, with the most recent episode starting 6 days ago. She states she felt clammy and diaphoretic 3 days ago. The cough has been severe enough that she has urinated on herself. She thought she saw blood in the urine but states her menses are imminent. Other associated symptoms include chest wall pain secondary to the cough, nasal congestion, postnasal drip and wheezing. She states she normally uses a asthma inhaler for her wheezing with relief. She denies fever. She denies a history of smoking. Her PCP had previously tried a trial off of lisinopril with no relief of her cough.   Past Medical History  Diagnosis Date  . Endometriosis    Past Surgical History  Procedure Laterality Date  . Tubal ligation     History reviewed. No pertinent family history. Social History  Substance Use Topics  . Smoking status: Never Smoker   . Smokeless tobacco: None  . Alcohol Use: No   OB History    No data available     Review of Systems  A complete 10 system review of systems was obtained and all systems are negative except as noted in the HPI and PMH.     Allergies  Codeine; Iodine; and Shellfish allergy  Home Medications   Prior to Admission medications   Medication Sig Start Date End Date Taking? Authorizing Provider  levocetirizine (XYZAL) 2.5 MG/5ML solution Take 2.5 mg by mouth every evening.   Yes Historical Provider, MD   lisinopril-hydrochlorothiazide (PRINZIDE,ZESTORETIC) 20-12.5 MG per tablet Take 1 tablet by mouth daily.    Historical Provider, MD  meclizine (ANTIVERT) 25 MG tablet Take 1 tablet (25 mg total) by mouth 3 (three) times daily as needed for dizziness. 04/19/13   Junious Silk, PA-C   BP 183/91 mmHg  Pulse 97  Temp(Src) 98.9 F (37.2 C) (Oral)  Resp 18  Ht  (1.6 m)  Wt 185 lb (83.915 kg)  BMI 32.78 kg/m2  SpO2 100%  LMP 12/29/2014   Physical Exam  Nursing note and vitals reviewed. General: Well-developed, well-nourished female in no acute distress; appearance consistent with age of record HENT: normocephalic; atraumatic Eyes: pupils equal, round and reactive to light; extraocular muscles intact Neck: supple Heart: regular rate and rhythm Chest: bilateral chest wall tenderness Lungs: clear to auscultation bilaterally; cough Abdomen: soft; nondistended; nontender; no masses or hepatosplenomegaly; bowel sounds present Extremities: No deformity; full range of motion; pulses normal Neurologic: Awake, alert and oriented; motor function intact in all extremities and symmetric; no facial droop Skin: Warm and dry Psychiatric: Normal mood and affect   ED Course  Procedures (including critical care time)  DIAGNOSTIC STUDIES: Oxygen Saturation is 100% on RA, normal by my interpretation.    COORDINATION OF CARE: 12:35 AM - Discussed treatment plan with pt at bedside. Pt verbalized understanding and agreed to plan.     MDM   Nursing notes and vitals signs, including pulse oximetry,  reviewed.  Summary of this visit's results, reviewed by myself:  Labs:  Results for orders placed or performed during the hospital encounter of 01/24/15 (from the past 24 hour(s))  Urinalysis, Routine w reflex microscopic (not at [facility])     Status: None   Collection Time: 01/24/15 12:33 AM  Result Value Ref Range   Color, Urine YELLOW YELLOW   APPearance CLEAR CLEAR   Specific Gravity, Urine  1.019 1.005 - 1.030   pH 6.5 5.0 - 8.0   Glucose, UA NEGATIVE NEGATIVE mg/dL   Hgb urine dipstick NEGATIVE NEGATIVE   Bilirubin Urine NEGATIVE NEGATIVE   Ketones, ur NEGATIVE NEGATIVE mg/dL   Protein, ur NEGATIVE NEGATIVE mg/dL   Urobilinogen, UA 1.0 0.0 - 1.0 mg/dL   Nitrite NEGATIVE NEGATIVE   Leukocytes, UA NEGATIVE NEGATIVE  Pregnancy, urine     Status: None   Collection Time: 01/24/15 12:33 AM  Result Value Ref Range   Preg Test, Ur NEGATIVE NEGATIVE    Imaging Studies: Dg Chest 2 View  01/24/2015  CLINICAL DATA:  44 year old female with cough for the past 2 years, worsening over the past 6 days. EXAM: CHEST  2 VIEW COMPARISON:  Chest x-ray 08/08/2012. FINDINGS: Lung volumes are normal. No consolidative airspace disease. No pleural effusions. No pneumothorax. No pulmonary nodule or mass noted. Pulmonary vasculature and the cardiomediastinal silhouette are within normal limits. IMPRESSION: No radiographic evidence of acute cardiopulmonary disease. Electronically Signed   By: Trudie Reedaniel  Entrikin M.D.   On: 01/24/2015 01:31   1:37 AM Cough improved, and movement improved after 2 neb treatment. Patient was given an [employer] research [employer] and educated in shoes.  I personally performed the services described in this documentation, which was scribed in my presence. The recorded information has been reviewed and is accurate.     Paula LibraJohn Trenee Igoe, MD 01/24/15 [phone]

## 2015-01-24 NOTE — ED Notes (Signed)
Pt reports coughing so much that she urinates on herself.  States urine is dark in color at times.

## 2015-01-24 NOTE — ED Notes (Signed)
Patient verbalizes understanding of discharge follow-up and medications.  Patient is ambulatory and stable.

## 2015-01-24 NOTE — Discharge Instructions (Signed)

## 2015-06-08 ENCOUNTER — Other Ambulatory Visit: Payer: Self-pay | Admitting: Obstetrics & Gynecology

## 2015-06-08 DIAGNOSIS — N644 Mastodynia: Secondary | ICD-10-CM

## 2015-06-13 ENCOUNTER — Ambulatory Visit
Admission: RE | Admit: 2015-06-13 | Discharge: 2015-06-13 | Disposition: A | Discharge status: Home / Self Care | Payer: BLUE CROSS/BLUE SHIELD | Source: Ambulatory Visit | Attending: Obstetrics & Gynecology | Admitting: Obstetrics & Gynecology

## 2015-06-13 DIAGNOSIS — N644 Mastodynia: Secondary | ICD-10-CM

## 2015-07-12 ENCOUNTER — Emergency Department (HOSPITAL_BASED_OUTPATIENT_CLINIC_OR_DEPARTMENT_OTHER)
Admission: EM | Admit: 2015-07-12 | Discharge: 2015-07-12 | Disposition: A | Discharge status: Home / Self Care | Payer: BLUE CROSS/BLUE SHIELD | Attending: Emergency Medicine | Admitting: Emergency Medicine

## 2015-07-12 ENCOUNTER — Emergency Department (HOSPITAL_BASED_OUTPATIENT_CLINIC_OR_DEPARTMENT_OTHER): Payer: BLUE CROSS/BLUE SHIELD

## 2015-07-12 ENCOUNTER — Encounter (HOSPITAL_BASED_OUTPATIENT_CLINIC_OR_DEPARTMENT_OTHER): Payer: Self-pay | Admitting: *Deleted

## 2015-07-12 DIAGNOSIS — Z8742 Personal history of other diseases of the female genital tract: Secondary | ICD-10-CM | POA: Insufficient documentation

## 2015-07-12 DIAGNOSIS — R300 Dysuria: Secondary | ICD-10-CM | POA: Diagnosis not present

## 2015-07-12 DIAGNOSIS — R079 Chest pain, unspecified: Secondary | ICD-10-CM | POA: Diagnosis not present

## 2015-07-12 DIAGNOSIS — Z3202 Encounter for pregnancy test, result negative: Secondary | ICD-10-CM | POA: Diagnosis not present

## 2015-07-12 DIAGNOSIS — M25512 Pain in left shoulder: Secondary | ICD-10-CM | POA: Insufficient documentation

## 2015-07-12 DIAGNOSIS — R350 Frequency of micturition: Secondary | ICD-10-CM | POA: Diagnosis not present

## 2015-07-12 DIAGNOSIS — R1010 Upper abdominal pain, unspecified: Secondary | ICD-10-CM | POA: Insufficient documentation

## 2015-07-12 DIAGNOSIS — Z79899 Other long term (current) drug therapy: Secondary | ICD-10-CM | POA: Diagnosis not present

## 2015-07-12 DIAGNOSIS — R109 Unspecified abdominal pain: Secondary | ICD-10-CM

## 2015-07-12 LAB — HEPATIC FUNCTION PANEL
ALT: 19 U/L (ref 14–54)
AST: 21 U/L (ref 15–41)
Albumin: 4.1 g/dL (ref 3.5–5.0)
Alkaline Phosphatase: 62 U/L (ref 38–126)
BILIRUBIN DIRECT: 0.1 mg/dL (ref 0.1–0.5)
BILIRUBIN TOTAL: 0.6 mg/dL (ref 0.3–1.2)
Indirect Bilirubin: 0.5 mg/dL (ref 0.3–0.9)
Total Protein: 8.2 g/dL — ABNORMAL HIGH (ref 6.5–8.1)

## 2015-07-12 LAB — URINALYSIS, ROUTINE W REFLEX MICROSCOPIC
BILIRUBIN URINE: NEGATIVE
GLUCOSE, UA: NEGATIVE mg/dL
HGB URINE DIPSTICK: NEGATIVE
KETONES UR: NEGATIVE mg/dL
Nitrite: NEGATIVE
PH: 6 (ref 5.0–8.0)
Protein, ur: NEGATIVE mg/dL
Specific Gravity, Urine: 1.023 (ref 1.005–1.030)

## 2015-07-12 LAB — BASIC METABOLIC PANEL WITH GFR
Anion gap: 4 — ABNORMAL LOW (ref 5–15)
BUN: 11 mg/dL (ref 6–20)
CO2: 25 mmol/L (ref 22–32)
Calcium: 8.9 mg/dL (ref 8.9–10.3)
Chloride: 107 mmol/L (ref 101–111)
Creatinine, Ser: 0.92 mg/dL (ref 0.44–1.00)
GFR calc Af Amer: >60 mL/min
GFR calc non Af Amer: >60 mL/min
Glucose, Bld: 87 mg/dL (ref 65–99)
Potassium: 3.6 mmol/L (ref 3.5–5.1)
Sodium: 136 mmol/L (ref 135–145)

## 2015-07-12 LAB — LIPASE, BLOOD: LIPASE: 31 U/L (ref 11–51)

## 2015-07-12 LAB — CBC
HCT: 35.3 % — ABNORMAL LOW (ref 36.0–46.0)
Hemoglobin: 11.4 g/dL — ABNORMAL LOW (ref 12.0–15.0)
MCH: 22.5 pg — AB (ref 26.0–34.0)
MCHC: 32.3 g/dL (ref 30.0–36.0)
MCV: 69.6 fL — ABNORMAL LOW (ref 78.0–100.0)
PLATELETS: 391 10*3/uL (ref 150–400)
RBC: 5.07 MIL/uL (ref 3.87–5.11)
RDW: 17.7 % — ABNORMAL HIGH (ref 11.5–15.5)
WBC: 5.9 10*3/uL (ref 4.0–10.5)

## 2015-07-12 LAB — URINE MICROSCOPIC-ADD ON

## 2015-07-12 LAB — TROPONIN I

## 2015-07-12 LAB — HCG, SERUM, QUALITATIVE: Preg, Serum: NEGATIVE

## 2015-07-12 NOTE — Discharge Instructions (Signed)
You were seen and evaluated today for your chest and shoulder pain. The exact cause of your symptoms is unclear at this time. I did review the results from your previous visit to the other ER. He should follow up outpatient with the primary care physician as well as with cardiology.  Nonspecific Chest Pain  Chest pain can be caused by many different conditions. There is always a chance that your pain could be related to something serious, such as a heart attack or a blood clot in your lungs. Chest pain can also be caused by conditions that are not life-threatening. If you have chest pain, it is very important to follow up with your health care provider. CAUSES  Chest pain can be caused by:  Heartburn.  Pneumonia or bronchitis.  Anxiety or stress.  Inflammation around your heart (pericarditis) or lung (pleuritis or pleurisy).  A blood clot in your lung.  A collapsed lung (pneumothorax). It can develop suddenly on its own (spontaneous pneumothorax) or from trauma to the chest.  Shingles infection (varicella-zoster virus).  Heart attack.  Damage to the bones, muscles, and cartilage that make up your chest wall. This can include:  Bruised bones due to injury.  Strained muscles or cartilage due to frequent or repeated coughing or overwork.  Fracture to one or more ribs.  Sore cartilage due to inflammation (costochondritis). RISK FACTORS  Risk factors for chest pain may include:  Activities that increase your risk for trauma or injury to your chest.  Respiratory infections or conditions that cause frequent coughing.  Medical conditions or overeating that can cause heartburn.  Heart disease or family history of heart disease.  Conditions or health behaviors that increase your risk of developing a blood clot.  Having had chicken pox (varicella zoster). SIGNS AND SYMPTOMS Chest pain can feel like:  Burning or tingling on the surface of your chest or deep in your  chest.  Crushing, pressure, aching, or squeezing pain.  Dull or sharp pain that is worse when you move, cough, or take a deep breath.  Pain that is also felt in your back, neck, shoulder, or arm, or pain that spreads to any of these areas. Your chest pain may come and go, or it may stay constant. DIAGNOSIS Lab tests or other studies may be needed to find the cause of your pain. Your health care provider may have you take a test called an ambulatory ECG (electrocardiogram). An ECG records your heartbeat patterns at the time the test is performed. You may also have other tests, such as:  Transthoracic echocardiogram (TTE). During echocardiography, sound waves are used to create a picture of all of the heart structures and to look at how blood flows through your heart.  Transesophageal echocardiogram (TEE).This is a more advanced imaging test that obtains images from inside your body. It allows your health care provider to see your heart in finer detail.  Cardiac monitoring. This allows your health care provider to monitor your heart rate and rhythm in real time.  Holter monitor. This is a portable device that records your heartbeat and can help to diagnose abnormal heartbeats. It allows your health care provider to track your heart activity for several days, if needed.  Stress tests. These can be done through exercise or by taking medicine that makes your heart beat more quickly.  Blood tests.  Imaging tests. TREATMENT  Your treatment depends on what is causing your chest pain. Treatment may include:  Medicines. These may include:  Acid blockers for heartburn.  Anti-inflammatory medicine.  Pain medicine for inflammatory conditions.  Antibiotic medicine, if an infection is present.  Medicines to dissolve blood clots.  Medicines to treat coronary artery disease.  Supportive care for conditions that do not require medicines. This may include:  Resting.  Applying heat or cold  packs to injured areas.  Limiting activities until pain decreases. HOME CARE INSTRUCTIONS  If you were prescribed an antibiotic medicine, finish it all even if you start to feel better.  Avoid any activities that bring on chest pain.  Do not use any tobacco products, including cigarettes, chewing tobacco, or electronic cigarettes. If you need help quitting, ask your health care provider.  Do not drink alcohol.  Take medicines only as directed by your health care provider.  Keep all follow-up visits as directed by your health care provider. This is important. This includes any further testing if your chest pain does not go away.  If heartburn is the cause for your chest pain, you may be told to keep your head raised (elevated) while sleeping. This reduces the chance that acid will go from your stomach into your esophagus.  Make lifestyle changes as directed by your health care provider. These may include:  Getting regular exercise. Ask your health care provider to suggest some activities that are safe for you.  Eating a heart-healthy diet. A registered dietitian can help you to learn healthy eating options.  Maintaining a healthy weight.  Managing diabetes, if necessary.  Reducing stress. SEEK MEDICAL CARE IF:  Your chest pain does not go away after treatment.  You have a rash with blisters on your chest.  You have a fever. SEEK IMMEDIATE MEDICAL CARE IF:   Your chest pain is worse.  You have an increasing cough, or you cough up blood.  You have severe abdominal pain.  You have severe weakness.  You faint.  You have chills.  You have sudden, unexplained chest discomfort.  You have sudden, unexplained discomfort in your arms, back, neck, or jaw.  You have shortness of breath at any time.  You suddenly start to sweat, or your skin gets clammy.  You feel nauseous or you vomit.  You suddenly feel light-headed or dizzy.  Your heart begins to beat quickly, or  it feels like it is skipping beats. These symptoms may represent a serious problem that is an emergency. Do not wait to see if the symptoms will go away. Get medical help right away. Call your local emergency services (911 in the [place] Do not drive yourself to the hospital.   This information is not intended to replace advice given to you by your health care provider. Make sure you discuss any questions you have with your health care provider.   Document Released: 12/27/2004 Document Revised: 04/09/2014 Document Reviewed: 10/23/2013 Elsevier Interactive Patient Education 2016 Elsevier Inc.  Abdominal Pain, Adult Many things can cause abdominal pain. Usually, abdominal pain is not caused by a disease and will improve without treatment. It can often be observed and treated at home. Your health care provider will do a physical exam and possibly order blood tests and X-rays to help determine the seriousness of your pain. However, in many cases, more time must pass before a clear cause of the pain can be found. Before that point, your health care provider may not know if you need more testing or further treatment. HOME CARE INSTRUCTIONS Monitor your abdominal pain for any changes. The following actions may help  to alleviate any discomfort you are experiencing:  Only take over-the-counter or prescription medicines as directed by your health care provider.  Do not take laxatives unless directed to do so by your health care provider.  Try a clear liquid diet (broth, tea, or water) as directed by your health care provider. Slowly move to a bland diet as tolerated. SEEK MEDICAL CARE IF:  You have unexplained abdominal pain.  You have abdominal pain associated with nausea or diarrhea.  You have pain when you urinate or have a bowel movement.  You experience abdominal pain that wakes you in the night.  You have abdominal pain that is worsened or improved by eating food.  You have abdominal pain  that is worsened with eating fatty foods.  You have a fever. SEEK IMMEDIATE MEDICAL CARE IF:  Your pain does not go away within 2 hours.  You keep throwing up (vomiting).  Your pain is felt only in portions of the abdomen, such as the right side or the left lower portion of the abdomen.  You pass bloody or black tarry stools. MAKE SURE YOU:  Understand these instructions.  Will watch your condition.  Will get help right away if you are not doing well or get worse.   This information is not intended to replace advice given to you by your health care provider. Make sure you discuss any questions you have with your health care provider.   Document Released: 12/27/2004 Document Revised: 12/08/2014 Document Reviewed: 11/26/2012 Elsevier Interactive Patient Education [employer]2016 Elsevier Inc.

## 2015-07-12 NOTE — ED Notes (Signed)
Chest pain for 4 months. Worse last night. Nausea. [facility]. Pain is worse with deep breathe.

## 2015-07-12 NOTE — ED Provider Notes (Signed)
CSN: [id number]     Arrival date & time 07/12/15  1142 History   First MD Initiated Contact with Patient 07/12/15 1205     Chief Complaint  Patient presents with  . Chest Pain     (Consider location/radiation/quality/duration/timing/severity/associated sxs/prior Treatment) HPI Comments: 45 y.o. Female presents for left shoulder, chest, and upper abdominal pain.  The patient reports that this has been going on for about 4 months and that it comes and goes and that last night it was worse than usual.  She reports that the discomfort seems to be mostly centered around her left shoulder.  She was seen for this same issue at another ED where she had an elevated D Dimer but normal VQ scan - she cannot have IV contrast secondary to allergy.  She reports the symptoms she has are the same as what she was having then and that they have never resolved and that no cause has ever been found.  She reports pain in the left shoulder in worse with laying flat and with deep breathing.  Denies leg pain or swelling.  No fever or chills.  No injuries.  Upper abdominal pain can be sharp or aching.  No nausea, vomiting, or diarrhea.  She was seen in an UC and sent here for reevaluation.  She also complains of urinary frequency and pain for which she has been drinking cranberry juice.   Past Medical History  Diagnosis Date  . Endometriosis    Past Surgical History  Procedure Laterality Date  . Tubal ligation     No family history on file. Social History  Substance Use Topics  . Smoking status: Never Smoker   . Smokeless tobacco: None  . Alcohol Use: No   OB History    No data available     Review of Systems  Constitutional: Negative for fever, chills and fatigue.  HENT: Negative for congestion, mouth sores, postnasal drip and rhinorrhea.   Respiratory: Negative for cough, chest tightness and shortness of breath.   Cardiovascular: Positive for chest pain. Negative for palpitations and leg swelling.   Gastrointestinal: Positive for abdominal pain. Negative for nausea, vomiting, diarrhea and constipation.  Genitourinary: Positive for dysuria and frequency.  Musculoskeletal: Negative for myalgias, back pain, neck pain and neck stiffness.  Skin: Negative for rash.  Neurological: Negative for dizziness, weakness, light-headedness and headaches.  Hematological: Does not bruise/bleed easily.      Allergies  Codeine; Iodine; and Shellfish allergy  Home Medications   Prior to Admission medications   Medication Sig Start Date End Date Taking? Authorizing Provider  Fluticasone Furoate-Vilanterol (BREO ELLIPTA IN) Inhale into the lungs.   Yes Historical Provider, MD  azithromycin (ZITHROMAX Z-PAK) 250 MG tablet 2 po day one, then 1 daily x 4 days 01/24/15   Paula LibraJohn Molpus, MD  fluconazole (DIFLUCAN) 150 MG tablet Take 1 tablet as needed for vaginal yeast infection. May repeat in 3 days if needed. 01/24/15   John Molpus, MD  levocetirizine (XYZAL) 2.5 MG/5ML solution Take 2.5 mg by mouth every evening.    Historical Provider, MD  lisinopril-hydrochlorothiazide (PRINZIDE,ZESTORETIC) 20-12.5 MG per tablet Take 1 tablet by mouth daily.    Historical Provider, MD  meclizine (ANTIVERT) 25 MG tablet Take 1 tablet (25 mg total) by mouth 3 (three) times daily as needed for dizziness. 04/19/13   Junious SilkHannah Merrell, PA-C   BP 114/79 mmHg  Pulse 77  Temp(Src) 98.1 F (36.7 C) (Oral)  Resp 20  Ht 5\' 3"  (1.6 m)  Wt 185 lb (83.915 kg)  BMI 32.78 kg/m2  SpO2 100%  LMP 06/21/2015 Physical Exam  Constitutional: She is oriented to person, place, and time. She appears well-developed and well-nourished. No distress.  HENT:  Head: Normocephalic and atraumatic.  Right Ear: External ear normal.  Left Ear: External ear normal.  Nose: Nose normal.  Mouth/Throat: Oropharynx is clear and moist. No oropharyngeal exudate.  Eyes: EOM are normal. Pupils are equal, round, and reactive to light.  Neck: Normal range of  motion. Neck supple.  Cardiovascular: Normal rate, regular rhythm, normal heart sounds and intact distal pulses.   No murmur heard. Pulmonary/Chest: Effort normal. No respiratory distress. She has no wheezes. She has no rales.  Abdominal: Soft. She exhibits no distension. There is no tenderness.  Musculoskeletal: Normal range of motion. She exhibits no edema or tenderness.  Neurological: She is alert and oriented to person, place, and time.  Skin: Skin is warm and dry. No rash noted. She is not diaphoretic.  Vitals reviewed.   ED Course  Procedures (including critical care time) Labs Review Labs Reviewed  BASIC METABOLIC PANEL - Abnormal; Notable for the following:    Anion gap 4 (*)    All other components within normal limits  CBC - Abnormal; Notable for the following:    Hemoglobin 11.4 (*)    HCT 35.3 (*)    MCV 69.6 (*)    MCH 22.5 (*)    RDW 17.7 (*)    All other components within normal limits  URINALYSIS, ROUTINE W REFLEX MICROSCOPIC (NOT AT [facility]) - Abnormal; Notable for the following:    Leukocytes, UA TRACE (*)    All other components within normal limits  HEPATIC FUNCTION PANEL - Abnormal; Notable for the following:    Total Protein 8.2 (*)    All other components within normal limits  URINE MICROSCOPIC-ADD ON - Abnormal; Notable for the following:    Squamous Epithelial / LPF 0-5 (*)    Bacteria, UA RARE (*)    All other components within normal limits  TROPONIN I  HCG, SERUM, QUALITATIVE  LIPASE, BLOOD    Imaging Review Ct Abdomen Pelvis Wo Contrast  07/12/2015  CLINICAL DATA:  Chest pain for 4 months worsened last night and began experiencing LEFT upper quadrant pain this morning with nausea, personal history of endometriosis EXAM: CT ABDOMEN AND PELVIS WITHOUT CONTRAST TECHNIQUE: Multidetector CT imaging of the abdomen and pelvis was performed following the standard protocol without IV contrast. Sagittal and coronal MPR images reconstructed from axial data  set. Patient drank dilute oral contrast for exam. COMPARISON:  06/03/2006 FINDINGS: Lung bases clear. Tiny nonobstructing calculus inferior pole RIGHT kidney. Within limits of a nonenhanced exam, no additional focal abnormalities of the liver, gallbladder, spleen, pancreas, kidneys, or adrenal glands. Normal appendix, ureters, bladder, uterus, and ovaries. Stomach and bowel loops normal appearance. No mass, adenopathy, free air, free fluid, hernia, or acute inflammatory process. Asymmetric sclerosis at LEFT SI joint. No additional focal osseous abnormalities. IMPRESSION: Tiny nonobstructing RIGHT renal calculus. No acute intra-abdominal or intrapelvic abnormalities. Asymmetric LEFT sacroiliitis progressive since prior study. Electronically Signed   By: Ulyses Southward M.D.   On: 07/12/2015 15:48   Dg Chest 2 View  07/12/2015  CLINICAL DATA:  Left side chest pain, back pain starting yesterday EXAM: CHEST  2 VIEW COMPARISON:  01/24/2015 FINDINGS: Cardiomediastinal silhouette is stable. No acute infiltrate or pleural effusion. No pulmonary edema. Bony thorax is unremarkable. IMPRESSION: No active cardiopulmonary disease. Electronically Signed  By: Natasha Mead M.D.   On: 07/12/2015 12:37   I have personally reviewed and evaluated these images and lab results as part of my medical decision-making.   EKG Interpretation   Date/Time:  Tuesday July 12 2015 11:55:01 EDT Ventricular Rate:  108 PR Interval:  138 QRS Duration: 54 QT Interval:  312 QTC Calculation: 418 R Axis:   56 Text Interpretation:  Sinus tachycardia Nonspecific T wave abnormality  Abnormal ECG No significant change since last tracing Confirmed by Kale Dols,  Nihira Puello ([id number]) on 07/12/2015 12:06:04 PM      MDM  Patient was seen and evaluated in stable condition.  Results from previous ER visit reviewed in care everywhere including normal VQ scan.  Concern for possible referred pain from abdomen to shoulder and so abdominal CT was obtained  without acute finding.  Chest xray unremarkable.  Labs unremarkable.  At this time D dimer not obtained as previously elevated for same symptoms with normal VQ scan.  Heart rate normalized without intervention.  Discussed all results with patient who expressed understanding and agreement with plan for discharge and outpatient cardiology follow up although she expressed frustration with not finding a cause for her symptoms.  Strict return precautions were given. Final diagnoses:  Left shoulder pain  Abdominal pain, unspecified abdominal location    1. Chest pain, unclear cause  2. Abdominal pain, unclear cause  Leta Baptist, MD 07/13/15 2114

## 2015-07-22 ENCOUNTER — Encounter: Payer: Self-pay | Admitting: Cardiology

## 2015-07-22 ENCOUNTER — Ambulatory Visit (INDEPENDENT_AMBULATORY_CARE_PROVIDER_SITE_OTHER): Payer: BLUE CROSS/BLUE SHIELD | Admitting: Cardiology

## 2015-07-22 VITALS — BP 130/90 | HR 88 | Ht 63.0 in | Wt 190.4 lb

## 2015-07-22 DIAGNOSIS — R079 Chest pain, unspecified: Secondary | ICD-10-CM | POA: Diagnosis not present

## 2015-07-22 NOTE — Progress Notes (Signed)
Cardiology Office Note   Date:  07/22/2015   ID:  Amy [name], DOB [birth year], MRN [id number]  PCP:  Vicente Males, PA  Cardiologist:   Regan Lemming, MD    Chief Complaint  Patient presents with  . Advice Only  . Chest Pain  . Dizziness  . Fatigue     History of Present Illness: Amy [name] is a 45 y.o. female who presents today for cardiology evaluation.   She presented to the ER on 4/11 with chest, shoulder, and upper abdominal pain.  She had a VQ scan for the same issue at another ER which as negative.  The symptoms have been occurring for the last four months.  The discomfort is usually centered around the left shoulder.  The pain in the shoulder is worse with lying flat and deep breathing.  The pain in the upper abdomen is sharp or aching.  She says that the pain at times lasts for days at a time. It is worse when she exerts herself, breathes deeply, or lays flat. She has been sleeping on 3 pillows recently due to shortness of breath when laying flat. She also says that her chest pain is worse when she moves her shoulder and arm. She says that she does not know of any alleviating factors. This discomfort has been bothering her since February.   Today, she denies symptoms of palpitations, chest pain, shortness of breath, orthopnea, PND, lower extremity edema, claudication, dizziness, presyncope, syncope, bleeding, or neurologic sequela. The patient is tolerating medications without difficulties and is otherwise without complaint today.    Past Medical History  Diagnosis Date  . Endometriosis    Past Surgical History  Procedure Laterality Date  . Tubal ligation       Current Outpatient Prescriptions  Medication Sig Dispense Refill  . albuterol (PROAIR HFA) 108 (90 Base) MCG/ACT inhaler Inhale 1 puff into the lungs daily as needed.    Marland Kitchen azithromycin (ZITHROMAX Z-PAK) 250 MG tablet 2 po day one, then 1 daily x 4 days 6 tablet 0  . Fluticasone  Furoate-Vilanterol (BREO ELLIPTA IN) Inhale 1 puff into the lungs daily.     Marland Kitchen levocetirizine (XYZAL) 2.5 MG/5ML solution Take 2.5 mg by mouth every evening.    Marland Kitchen lisinopril-hydrochlorothiazide (PRINZIDE,ZESTORETIC) 20-12.5 MG per tablet Take 1 tablet by mouth daily.    . meclizine (ANTIVERT) 25 MG tablet Take 1 tablet (25 mg total) by mouth 3 (three) times daily as needed for dizziness. 30 tablet 0   No current facility-administered medications for this visit.    Allergies:   Amlodipine; Amoxicillin; Latex; Codeine; Iodine; and Shellfish allergy   Social History:  The patient  reports that she has never smoked. She does not have any smokeless tobacco history on file. She reports that she does not drink alcohol or use illicit drugs.   Family History:  The patient's family history includes COPD in her father; Heart failure in her paternal grandfather; Hypertension in her mother; Lung cancer in her maternal grandmother; Narcolepsy in her brother.    ROS:  Please see the history of present illness.   Otherwise, review of systems is positive for chest pain, SOB, palpitations, snoring, shoulder pain, dizziness.   All other systems are reviewed and negative.    PHYSICAL EXAM: VS:  BP 130/90 mmHg  Pulse 88  Ht  (1.6 m)  Wt 190 lb 6.4 oz (86.365 kg)  BMI 33.74 kg/m2  LMP 06/21/2015 , BMI Body mass  index is 33.74 kg/(m^2). GEN: Well nourished, well developed, in no acute distress HEENT: normal Neck: no JVD, carotid bruits, or masses Cardiac: RRR; no murmurs, rubs, or gallops,no edema  Respiratory:  clear to auscultation bilaterally, normal work of breathing GI: soft, nontender, nondistended, + BS MS: no deformity or atrophy Skin: warm and dry Neuro:  Strength and sensation are intact Psych: euthymic mood, full affect  EKG:  EKG is ordered today. The ekg ordered today shows sinus rhythm, rate 88, nonspecific T wave abnormality   Recent Labs: 07/12/2015: ALT 19; BUN 11; Creatinine,  Ser 0.92; Hemoglobin 11.4*; Platelets 391; Potassium 3.6; Sodium 136    Lipid Panel  No results found for: CHOL, TRIG, HDL, CHOLHDL, VLDL, LDLCALC, LDLDIRECT   Wt Readings from Last 3 Encounters:  07/22/15 190 lb 6.4 oz (86.365 kg)  07/12/15 185 lb (83.915 kg)  01/24/15 185 lb (83.915 kg)      Other studies Reviewed: Additional studies/ records that were reviewed today include: ER notes    ASSESSMENT AND PLAN:  1.  Chest/shoulder pain: This time, it is unclear what the cause of her pain is. It does not sound like a cardiac cause of chest discomfort as it is worse with moving, has been lasting for days at a time, and is not necessarily associated with exertion. That being said, she has been short of breath and is now sleeping on 3 pillows due to difficulty laying flat. Due to that, we [name] order an echocardiogram to determine if she has any structural heart disease that could explain her shortness of breath and chest pain.   Current medicines are reviewed at length with the patient today.   The patient does not have concerns regarding her medicines.  The following changes were made today:  none  Labs/ tests ordered today include:  Orders Placed This Encounter  Procedures  . EKG 12-Lead  . ECHOCARDIOGRAM COMPLETE     Disposition:   FU with [name] pending TTE  Signed, [name] [name], MD  07/22/2015 10:01 AM     [facility] [address] [place] [address] [phone] (office) [phone] (fax)

## 2015-07-22 NOTE — Patient Instructions (Addendum)
Medication Instructions:  Your physician recommends that you continue on your current medications as directed. Please refer to the Current Medication list given to you today.  Labwork: None  ordered  Testing/Procedures: Your physician has requested that you have an echocardiogram. Echocardiography is a painless test that uses sound waves to create images of your heart. It provides your doctor with information about the size and shape of your heart and how well your heart's chambers and valves are working. This procedure takes approximately one hour. There are no restrictions for this procedure.  Follow-Up: We will determine follow up, if needed, after results of echocardiogram has been reviewed by Dr. Elberta Fortisamnitz.  We will call you with the results.  If you need a refill on your cardiac medications before your next appointment, please call your pharmacy.  Thank you for choosing [facility]!!   Dory HornSherri Ediberto Sens, RN [phone]  Echocardiogram An echocardiogram, or echocardiography, uses sound waves (ultrasound) to produce an image of your heart. The echocardiogram is simple, painless, obtained within a short period of time, and offers valuable information to your health care provider. The images from an echocardiogram can provide information such as:  Evidence of coronary artery disease (CAD).  Heart size.  Heart muscle function.  Heart valve function.  Aneurysm detection.  Evidence of a past heart attack.  Fluid buildup around the heart.  Heart muscle thickening.  Assess heart valve function. LET [facility] KNOW ABOUT:  Any allergies you have.  All medicines you are taking, including vitamins, herbs, eye drops, creams, and over-the-counter medicines.  Previous problems you or members of your family have had with the use of anesthetics.  Any blood disorders you have.  Previous surgeries you have had.  Medical conditions you have.  Possibility of  pregnancy, if this applies. BEFORE THE PROCEDURE  No special preparation is needed. Eat and drink normally.  PROCEDURE   In order to produce an image of your heart, gel will be applied to your chest and a wand-like tool (transducer) will be moved over your chest. The gel will help transmit the sound waves from the transducer. The sound waves will harmlessly bounce off your heart to allow the heart images to be captured in real-time motion. These images will then be recorded.  You may need an IV to receive a medicine that improves the quality of the pictures. AFTER THE PROCEDURE You may return to your normal schedule including diet, activities, and medicines, unless your health care provider tells you otherwise.   This information is not intended to replace advice given to you by your health care provider. Make sure you discuss any questions you have with your health care provider.   Document Released: 03/16/2000 Document Revised: 04/09/2014 Document Reviewed: 11/24/2012 Elsevier Interactive Patient Education [employer] Elsevier Inc.

## 2015-08-04 ENCOUNTER — Telehealth (HOSPITAL_COMMUNITY): Payer: Self-pay | Admitting: Radiology

## 2015-08-05 ENCOUNTER — Other Ambulatory Visit: Payer: Self-pay

## 2015-08-05 ENCOUNTER — Ambulatory Visit (HOSPITAL_COMMUNITY): Payer: BLUE CROSS/BLUE SHIELD | Attending: Cardiology

## 2015-08-05 DIAGNOSIS — I340 Nonrheumatic mitral (valve) insufficiency: Secondary | ICD-10-CM | POA: Diagnosis not present

## 2015-08-05 DIAGNOSIS — R079 Chest pain, unspecified: Secondary | ICD-10-CM | POA: Diagnosis not present

## 2015-08-12 ENCOUNTER — Other Ambulatory Visit (HOSPITAL_COMMUNITY): Payer: BLUE CROSS/BLUE SHIELD

## 2015-08-12 ENCOUNTER — Telehealth: Payer: Self-pay | Admitting: Cardiology

## 2015-08-12 NOTE — Telephone Encounter (Signed)
F/u  Pt returning RN phone call- echo results. Please call back and discuss.   

## 2015-08-12 NOTE — Telephone Encounter (Signed)
Notified the pt of her echo results per Dr Elberta Fortisamnitz, as mentioned below.  Pt verbalized understanding.   Notes Recorded by Will Jorja LoaMartin Camnitz, MD on 08/08/2015 at 8:53 AM No abnormality on TTE to explain SOB.

## 2017-05-08 IMAGING — DX DG CHEST 2V
2 series · 2 of 2 positions shown · non-contrast
Comparison: 01/24/2015

CLINICAL DATA: Left side chest pain, back pain starting yesterday

EXAM:
CHEST  2 VIEW

[chest pa]
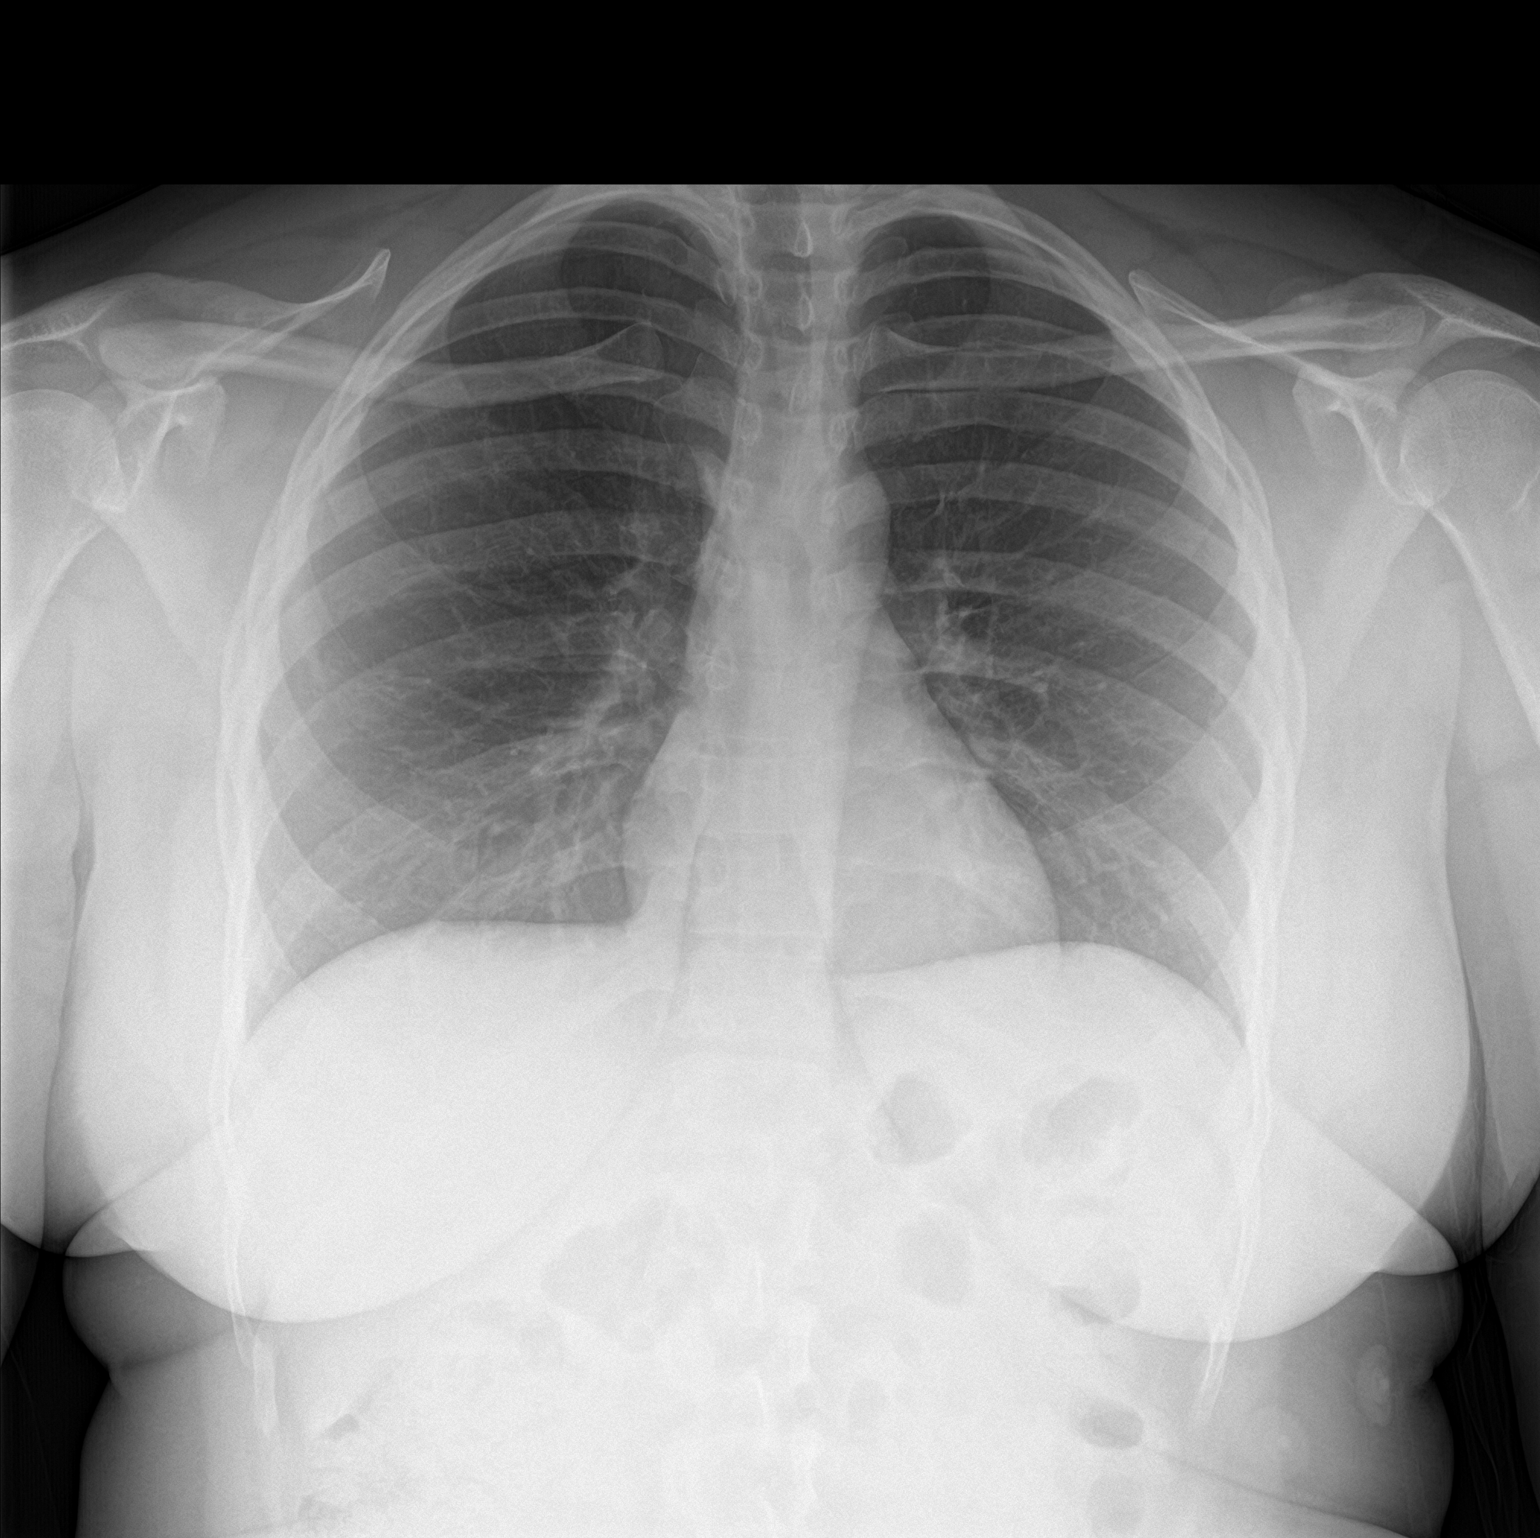

[chest lat]
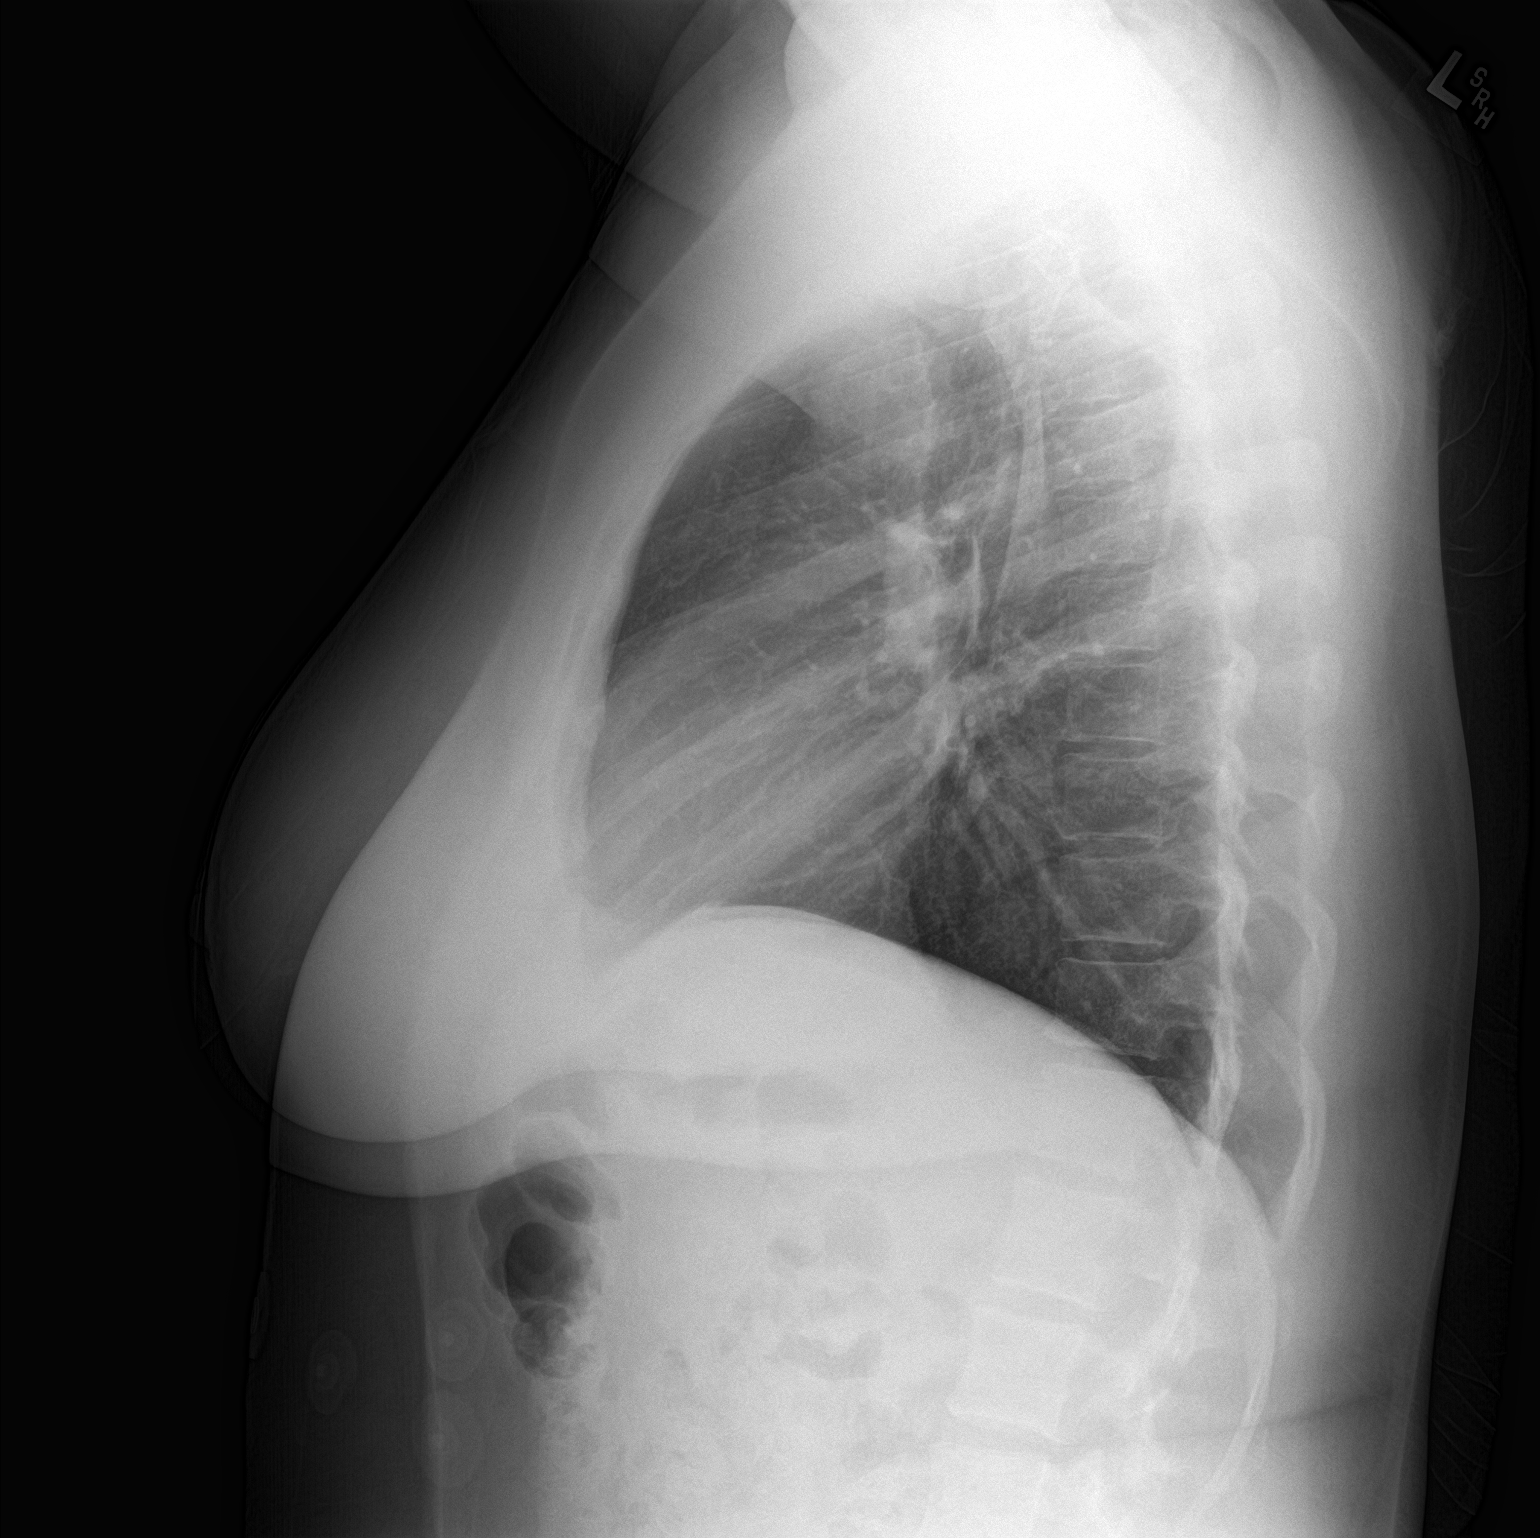

[2 of 2 positions shown; findings below may reference images not displayed]

FINDINGS: Cardiomediastinal silhouette is stable. No acute infiltrate or
pleural effusion. No pulmonary edema. Bony thorax is unremarkable.
IMPRESSION: No active cardiopulmonary disease.

## 2017-05-08 IMAGING — CT CT ABD-PELV W/O CM
2 of 4 series · 17 of 46 positions shown, 19 images · non-contrast
Comparison: 06/03/2006

CLINICAL DATA: Chest pain for 4 months worsened last night and
began experiencing LEFT upper quadrant pain this morning with
nausea, personal history of endometriosis

EXAM:
CT ABDOMEN AND PELVIS WITHOUT CONTRAST
TECHNIQUE: Multidetector CT imaging of the abdomen and pelvis was performed
following the standard protocol without IV contrast. Sagittal and
coronal MPR images reconstructed from axial data set. Patient drank
dilute oral contrast for exam.

[Series 2: axial st · axial · 0.80mm/px · z∈[-426,-32]mm · 14 of 87 slices shown, 16 images]
[im 4/87  soft-tissue]
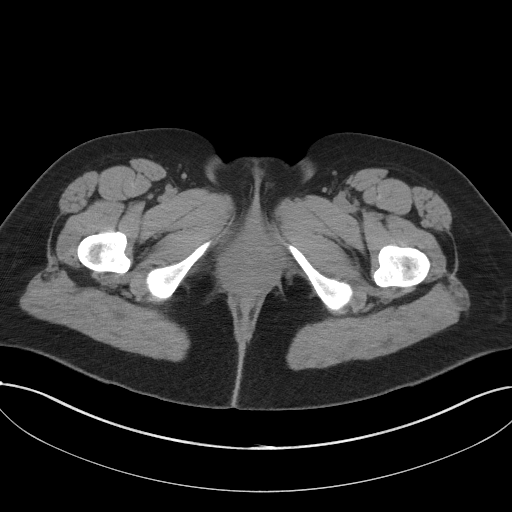
[im 4/87  bone]
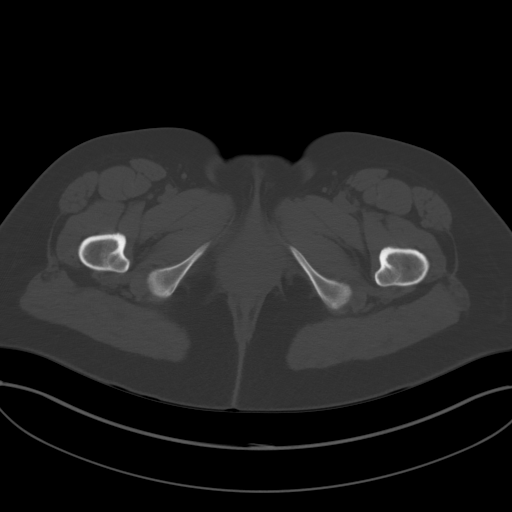
[im 10/87  soft-tissue]
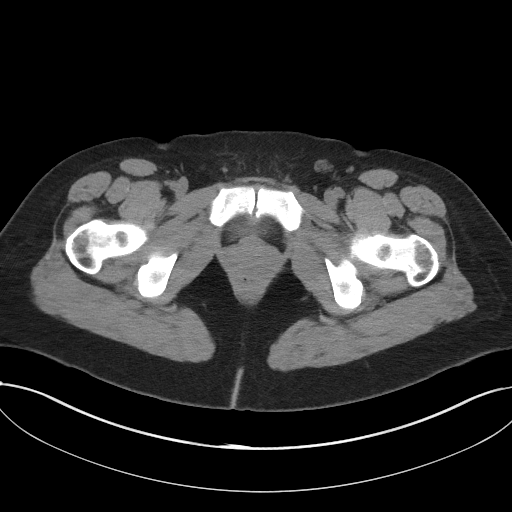
[im 16/87  soft-tissue]
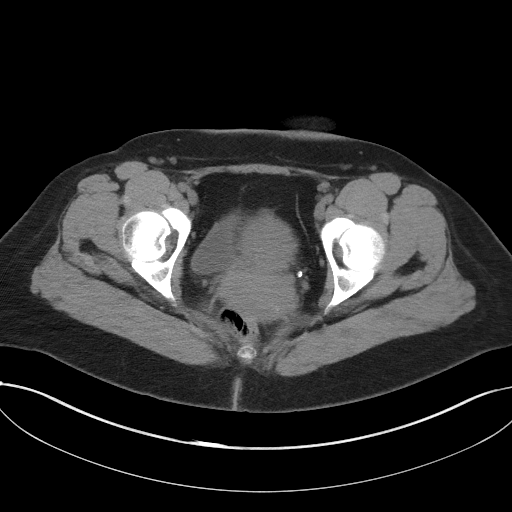
[im 23/87  soft-tissue]
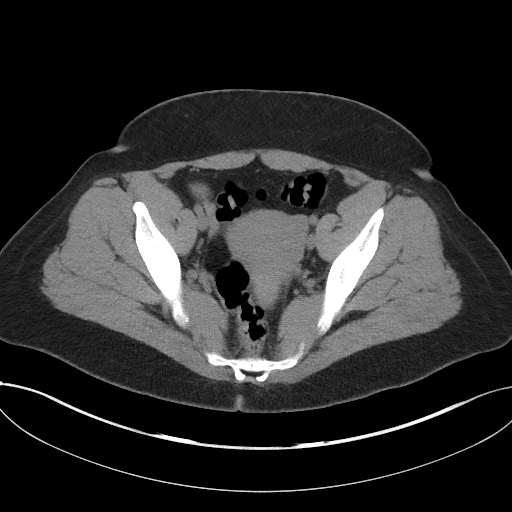
[im 29/87  soft-tissue]
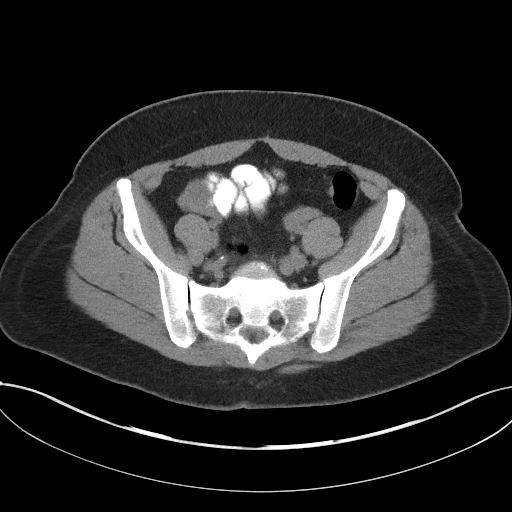
[im 36/87  soft-tissue]
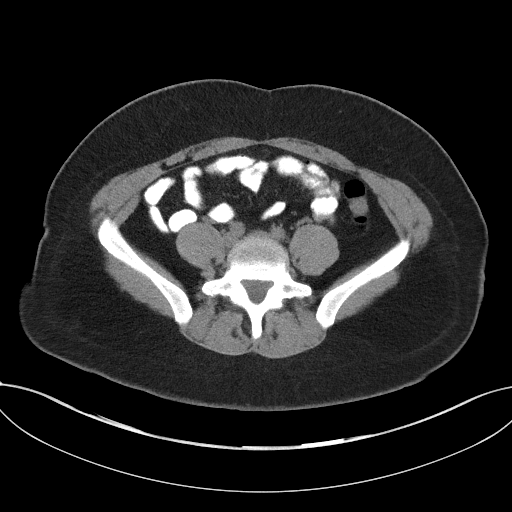
[im 42/87  soft-tissue]
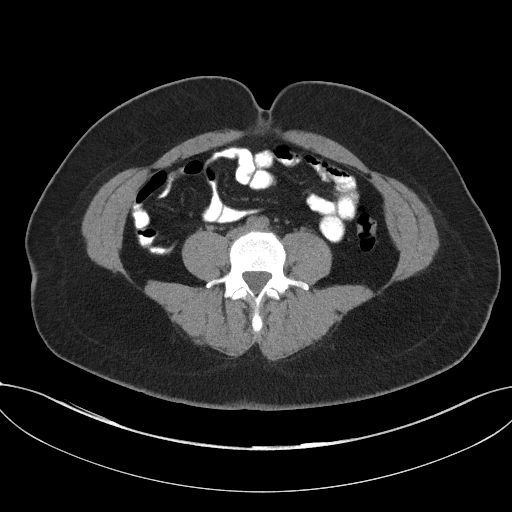
[im 45/87  soft-tissue]
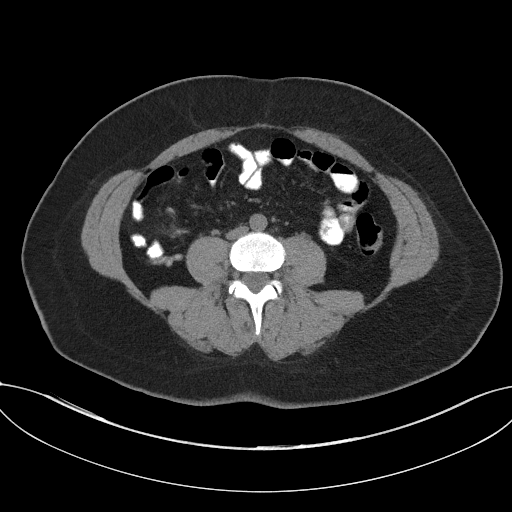
[im 51/87  soft-tissue]
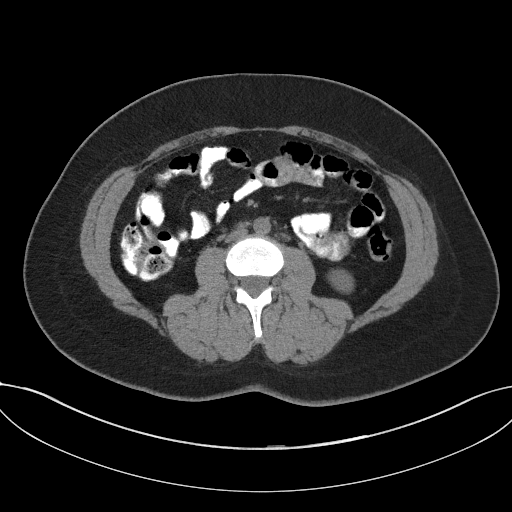
[im 51/87  bone]
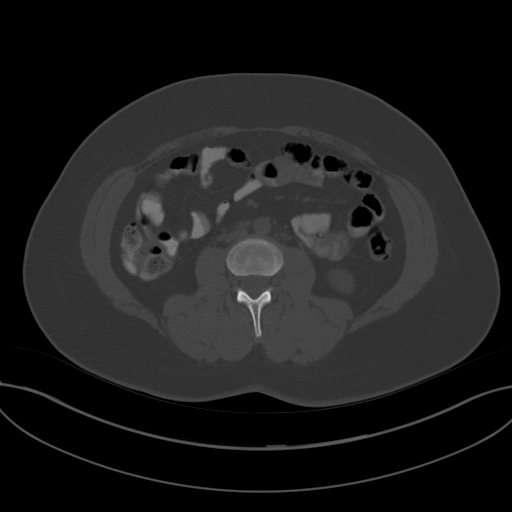
[im 58/87  soft-tissue]
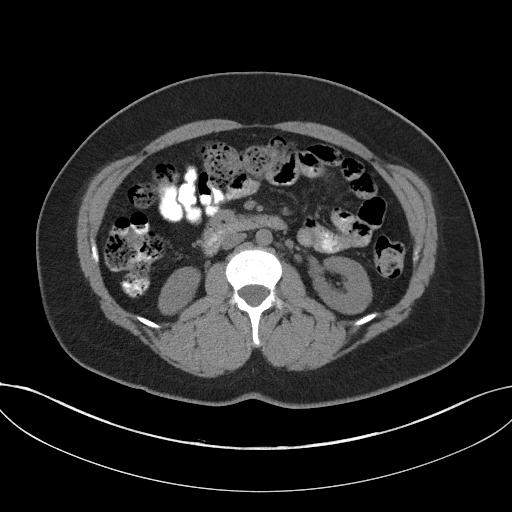
[im 64/87  soft-tissue]
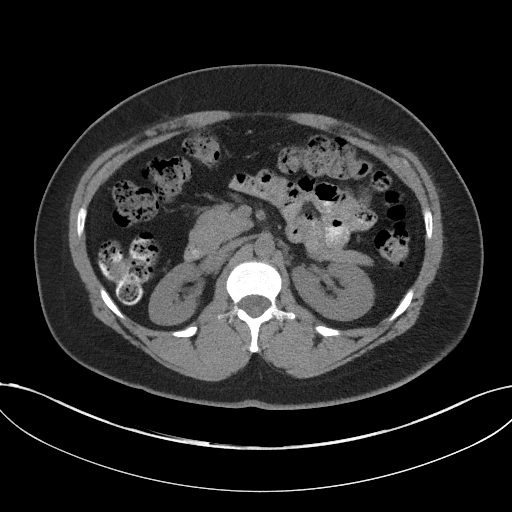
[im 71/87  soft-tissue]
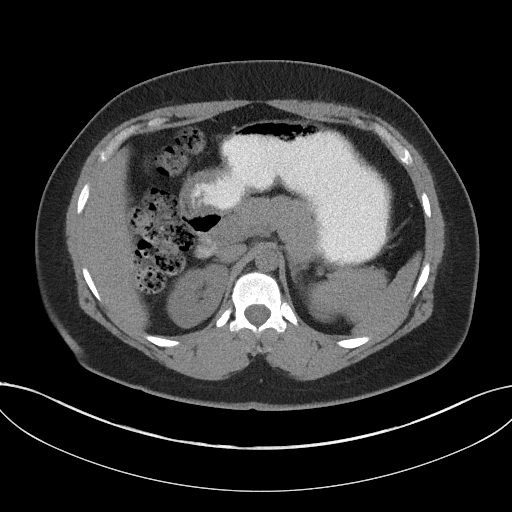
[im 77/87  soft-tissue]
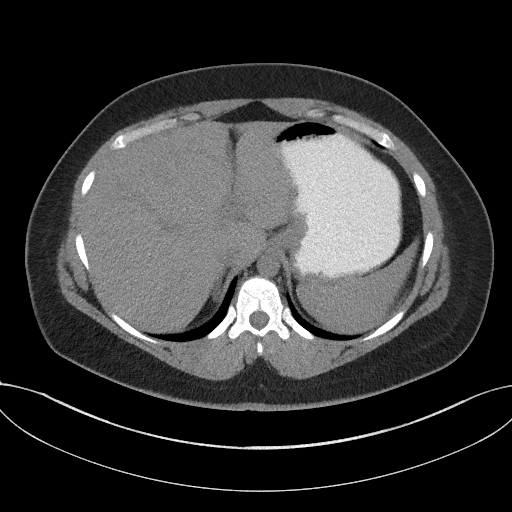
[im 83/87  soft-tissue]
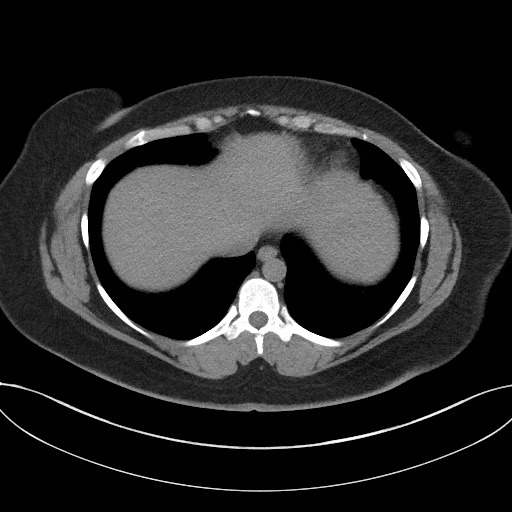

[Series 4: coronal st · coronal · 0.77mm/px · 3 of 84 slices shown]
[im 28/84  soft-tissue]
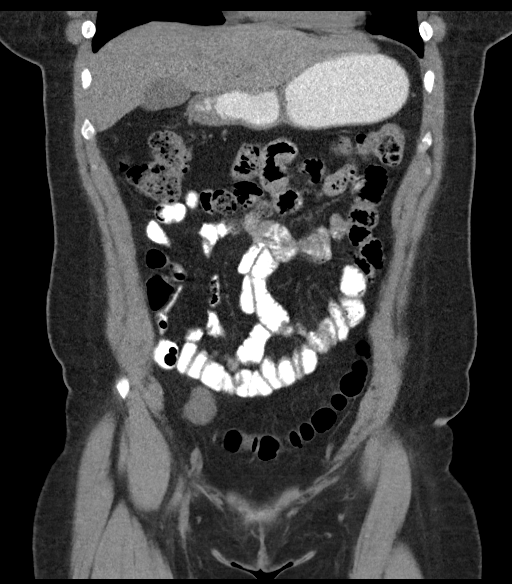
[im 37/84  soft-tissue]
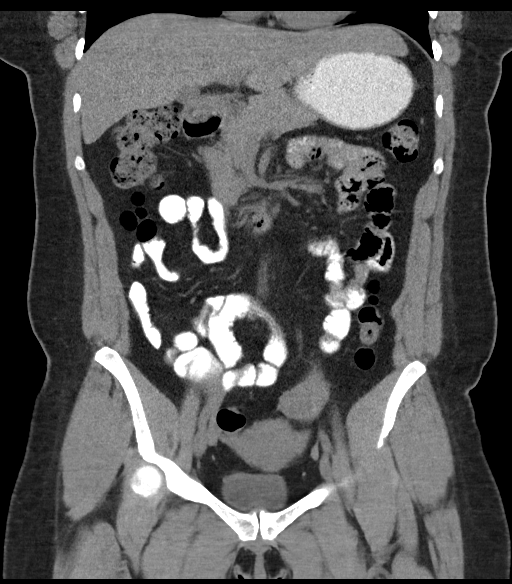
[im 47/84  soft-tissue]
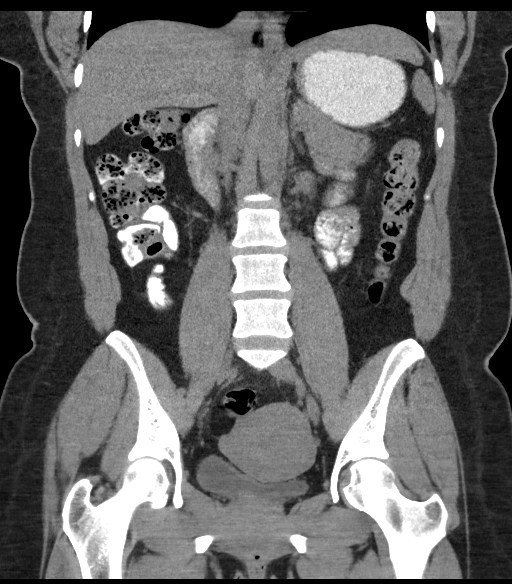

[17 of 46 positions shown; findings below may reference images not displayed]

FINDINGS: Lung bases clear.

Tiny nonobstructing calculus inferior pole RIGHT kidney.

Within limits of a nonenhanced exam, no additional focal
abnormalities of the liver, gallbladder, spleen, pancreas, kidneys,
or adrenal glands.

Normal appendix, ureters, bladder, uterus, and ovaries.

Stomach and bowel loops normal appearance.

No mass, adenopathy, free air, free fluid, hernia, or acute
inflammatory process.

Asymmetric sclerosis at LEFT SI joint.

No additional focal osseous abnormalities.
IMPRESSION: Tiny nonobstructing RIGHT renal calculus.

No acute intra-abdominal or intrapelvic abnormalities.

Asymmetric LEFT sacroiliitis progressive since prior study.

## 2017-06-11 ENCOUNTER — Ambulatory Visit: Payer: BLUE CROSS/BLUE SHIELD | Admitting: Obstetrics and Gynecology

## 2017-06-11 ENCOUNTER — Encounter: Payer: Self-pay | Admitting: Obstetrics and Gynecology

## 2017-06-11 VITALS — BP 142/87 | HR 82 | Ht 63.0 in | Wt 204.0 lb

## 2017-06-11 DIAGNOSIS — Z113 Encounter for screening for infections with a predominantly sexual mode of transmission: Secondary | ICD-10-CM

## 2017-06-11 DIAGNOSIS — Z01419 Encounter for gynecological examination (general) (routine) without abnormal findings: Secondary | ICD-10-CM | POA: Diagnosis not present

## 2017-06-11 DIAGNOSIS — Z124 Encounter for screening for malignant neoplasm of cervix: Secondary | ICD-10-CM | POA: Diagnosis not present

## 2017-06-11 DIAGNOSIS — Z1151 Encounter for screening for human papillomavirus (HPV): Secondary | ICD-10-CM

## 2017-06-11 DIAGNOSIS — R102 Pelvic and perineal pain: Secondary | ICD-10-CM

## 2017-06-11 NOTE — Patient Instructions (Addendum)
Contraception Choices Contraception, also called birth control, refers to methods or devices that prevent pregnancy. Hormonal methods Contraceptive implant A contraceptive implant is a thin, plastic tube that contains a hormone. It is inserted into the upper part of the arm. It can remain in place for up to 3 years. Progestin-only injections Progestin-only injections are injections of progestin, a synthetic form of the hormone progesterone. They are given every 3 months by a health care provider. Document Released: 03/19/2005 Document Revised: 04/21/2016 Document Reviewed: 04/21/2016 Elsevier Interactive Patient Education  2018 [employer]   Levonorgestrel intrauterine device (IUD) What is this medicine? LEVONORGESTREL IUD (LEE voe nor jes trel) is a contraceptive (birth control) device. The device is placed inside the uterus by a healthcare professional. It is used to prevent pregnancy. This device can also be used to treat heavy bleeding that occurs during your period. This medicine may be used for other purposes; ask your health care provider or pharmacist if you have questions. COMMON BRAND NAME(S): Cameron AliKyleena, LILETTA, Mirena, Skyla What should I tell my health care provider before I take this medicine? They need to know if you have any of these conditions: -abnormal Pap smear -cancer of the breast, uterus, or cervix -diabetes -endometritis -genital or pelvic infection now or in the past -have more than one sexual partner or your partner has more than one partner -heart disease -history of an ectopic or tubal pregnancy -immune system problems -IUD in place -liver disease or tumor -problems with blood clots or take blood-thinners -seizures -use intravenous drugs -uterus of unusual shape -vaginal bleeding that has not been explained -an unusual or allergic reaction to levonorgestrel, other hormones, silicone, or polyethylene, medicines, foods, dyes, or preservatives -pregnant or  trying to get pregnant -breast-feeding How should I use this medicine? This device is placed inside the uterus by a health care professional. Talk to your pediatrician regarding the use of this medicine in children. Special care may be needed. Overdosage: If you think you have taken too much of this medicine contact a poison control center or emergency room at once. NOTE: This medicine is only for you. Do not share this medicine with others. What if I miss a dose? This does not apply. Depending on the brand of device you have inserted, the device will need to be replaced every 3 to 5 years if you wish to continue using this type of birth control. What may interact with this medicine? Do not take this medicine with any of the following medications: -amprenavir -bosentan -fosamprenavir This medicine may also interact with the following medications: -aprepitant -armodafinil -barbiturate medicines for inducing sleep or treating seizures -bexarotene -boceprevir -griseofulvin -medicines to treat seizures like carbamazepine, ethotoin, felbamate, oxcarbazepine, phenytoin, topiramate -modafinil -pioglitazone -rifabutin -rifampin -rifapentine -some medicines to treat HIV infection like atazanavir, efavirenz, indinavir, lopinavir, nelfinavir, tipranavir, ritonavir -St. John's wort -warfarin This list may not describe all possible interactions. Give your health care provider a list of all the medicines, herbs, non-prescription drugs, or dietary supplements you use. Also tell them if you smoke, drink alcohol, or use illegal drugs. Some items may interact with your medicine. What should I watch for while using this medicine? Visit your doctor or health care professional for regular check ups. See your doctor if you or your partner has sexual contact with others, becomes HIV positive, or gets a sexual transmitted disease. This product does not protect you against HIV infection (AIDS) or other  sexually transmitted diseases. You can check the placement of the  IUD yourself by reaching up to the top of your vagina with clean fingers to feel the threads. Do not pull on the threads. It is a good habit to check placement after each menstrual period. Call your doctor right away if you feel more of the IUD than just the threads or if you cannot feel the threads at all. The IUD may come out by itself. You may become pregnant if the device comes out. If you notice that the IUD has come out use a backup birth control method like condoms and call your health care provider. Using tampons will not change the position of the IUD and are okay to use during your period. This IUD can be safely scanned with magnetic resonance imaging (MRI) only under specific conditions. Before you have an MRI, tell your healthcare provider that you have an IUD in place, and which type of IUD you have in place. What side effects may I notice from receiving this medicine? Side effects that you should report to your doctor or health care professional as soon as possible: -allergic reactions like skin rash, itching or hives, swelling of the face, lips, or tongue -fever, flu-like symptoms -genital sores -high blood pressure -no menstrual period for 6 weeks during use -pain, swelling, warmth in the leg -pelvic pain or tenderness -severe or sudden headache -signs of pregnancy -stomach cramping -sudden shortness of breath -trouble with balance, talking, or walking -unusual vaginal bleeding, discharge -yellowing of the eyes or skin Side effects that usually do not require medical attention (report to your doctor or health care professional if they continue or are bothersome): -acne -breast pain -change in sex drive or performance -changes in weight -cramping, dizziness, or faintness while the device is being inserted -headache -irregular menstrual bleeding within first 3 to 6 months of use -nausea This list may not  describe all possible side effects. Call your doctor for medical advice about side effects. You may report side effects to FDA at 1-800-FDA-1088. Where should I keep my medicine? This does not apply. NOTE: This sheet is a summary. It may not cover all possible information. If you have questions about this medicine, talk to your doctor, pharmacist, or health care provider.  2018 Elsevier/Gold Standard (2015-12-30 14:14:56)  Medroxyprogesterone injection [Contraceptive] What is this medicine? MEDROXYPROGESTERONE (me DROX ee proe JES te rone) contraceptive injections prevent pregnancy. They provide effective birth control for 3 months. Depo-subQ Provera 104 is also used for treating pain related to endometriosis. This medicine may be used for other purposes; ask your health care provider or pharmacist if you have questions. COMMON BRAND NAME(S): Depo-Provera, Depo-subQ Provera 104 What should I tell my health care provider before I take this medicine? They need to know if you have any of these conditions: -frequently drink alcohol -asthma -blood vessel disease or a history of a blood clot in the lungs or legs -bone disease such as osteoporosis -breast cancer -diabetes -eating disorder (anorexia nervosa or bulimia) -high blood pressure -HIV infection or AIDS -kidney disease -liver disease -mental depression -migraine -seizures (convulsions) -stroke -tobacco smoker -vaginal bleeding -an unusual or allergic reaction to medroxyprogesterone, other hormones, medicines, foods, dyes, or preservatives -pregnant or trying to get pregnant -breast-feeding How should I use this medicine? Depo-Provera Contraceptive injection is given into a muscle. Depo-subQ Provera 104 injection is given under the skin. These injections are given by a health care professional. You must not be pregnant before getting an injection. The injection is usually given during the first  5 days after the start of a menstrual  period or 6 weeks after delivery of a baby. Talk to your pediatrician regarding the use of this medicine in children. Special care may be needed. These injections have been used in female children who have started having menstrual periods. Overdosage: If you think you have taken too much of this medicine contact a poison control center or emergency room at once. NOTE: This medicine is only for you. Do not share this medicine with others. What if I miss a dose? Try not to miss a dose. You must get an injection once every 3 months to maintain birth control. If you cannot keep an appointment, call and reschedule it. If you wait longer than 13 weeks between Depo-Provera contraceptive injections or longer than 14 weeks between Depo-subQ Provera 104 injections, you could get pregnant. Use another method for birth control if you miss your appointment. You may also need a pregnancy test before receiving another injection. What may interact with this medicine? Do not take this medicine with any of the following medications: -bosentan This medicine may also interact with the following medications: -aminoglutethimide -antibiotics or medicines for infections, especially rifampin, rifabutin, rifapentine, and griseofulvin -aprepitant -barbiturate medicines such as phenobarbital or primidone -bexarotene -carbamazepine -medicines for seizures like ethotoin, felbamate, oxcarbazepine, phenytoin, topiramate -modafinil -St. John's wort This list may not describe all possible interactions. Give your health care provider a list of all the medicines, herbs, non-prescription drugs, or dietary supplements you use. Also tell them if you smoke, drink alcohol, or use illegal drugs. Some items may interact with your medicine. What should I watch for while using this medicine? This drug does not protect you against HIV infection (AIDS) or other sexually transmitted diseases. Use of this product may cause you to lose calcium  from your bones. Loss of calcium may cause weak bones (osteoporosis). Only use this product for more than 2 years if other forms of birth control are not right for you. The longer you use this product for birth control the more likely you will be at risk for weak bones. Ask your health care professional how you can keep strong bones. You may have a change in bleeding pattern or irregular periods. Many females stop having periods while taking this drug. If you have received your injections on time, your chance of being pregnant is very low. If you think you may be pregnant, see your health care professional as soon as possible. Tell your health care professional if you want to get pregnant within the next year. The effect of this medicine may last a long time after you get your last injection. What side effects may I notice from receiving this medicine? Side effects that you should report to your doctor or health care professional as soon as possible: -allergic reactions like skin rash, itching or hives, swelling of the face, lips, or tongue -breast tenderness or discharge -breathing problems -changes in vision -depression -feeling faint or lightheaded, falls -fever -pain in the abdomen, chest, groin, or leg -problems with balance, talking, walking -unusually weak or tired -yellowing of the eyes or skin Side effects that usually do not require medical attention (report to your doctor or health care professional if they continue or are bothersome): -acne -fluid retention and swelling -headache -irregular periods, spotting, or absent periods -temporary pain, itching, or skin reaction at site where injected -weight gain This list may not describe all possible side effects. Call your doctor for medical advice about side  effects. You may report side effects to FDA at 1-800-FDA-1088. Where should I keep my medicine? This does not apply. The injection will be given to you by a health care  professional. NOTE: This sheet is a summary. It may not cover all possible information. If you have questions about this medicine, talk to your doctor, pharmacist, or health care provider.  2018 Elsevier/Gold Standard (2008-04-09 18:37:56)   Leuprolide depot injection What is this medicine? LEUPROLIDE (loo PROE lide) is a man-made protein that acts like a natural hormone in the body. It decreases testosterone in men and decreases estrogen in women. In men, this medicine is used to treat advanced prostate cancer. In women, some forms of this medicine may be used to treat endometriosis, uterine fibroids, or other female hormone-related problems. This medicine may be used for other purposes; ask your health care provider or pharmacist if you have questions. COMMON BRAND NAME(S): Eligard, Lupron Depot, Lupron Depot-Ped, Viadur What should I tell my health care provider before I take this medicine? They need to know if you have any of these conditions: -diabetes -heart disease or previous heart attack -high blood pressure -high cholesterol -mental illness -osteoporosis -pain or difficulty passing urine -seizures -spinal cord metastasis -stroke -suicidal thoughts, plans, or attempt; a previous suicide attempt by you or a family member -tobacco smoker -unusual vaginal bleeding (women) -an unusual or allergic reaction to leuprolide, benzyl alcohol, other medicines, foods, dyes, or preservatives -pregnant or trying to get pregnant -breast-feeding How should I use this medicine? This medicine is for injection into a muscle or for injection under the skin. It is given by a health care professional in a hospital or clinic setting. The specific product will determine how it will be given to you. Make sure you understand which product you receive and how often you will receive it. Talk to your pediatrician regarding the use of this medicine in children. Special care may be needed. Overdosage: If  you think you have taken too much of this medicine contact a poison control center or emergency room at once. NOTE: This medicine is only for you. Do not share this medicine with others. What if I miss a dose? It is important not to miss a dose. Call your doctor or health care professional if you are unable to keep an appointment. Depot injections: Depot injections are given either once-monthly, every 12 weeks, every 16 weeks, or every 24 weeks depending on the product you are prescribed. The product you are prescribed will be based on if you are female or female, and your condition. Make sure you understand your product and dosing. What may interact with this medicine? Do not take this medicine with any of the following medications: -chasteberry This medicine may also interact with the following medications: -herbal or dietary supplements, like black cohosh or DHEA -female hormones, like estrogens or progestins and birth control pills, patches, rings, or injections -female hormones, like testosterone This list may not describe all possible interactions. Give your health care provider a list of all the medicines, herbs, non-prescription drugs, or dietary supplements you use. Also tell them if you smoke, drink alcohol, or use illegal drugs. Some items may interact with your medicine. What should I watch for while using this medicine? Visit your doctor or health care professional for regular checks on your progress. During the first weeks of treatment, your symptoms may get worse, but then will improve as you continue your treatment. You may get hot flashes, increased bone pain,  increased difficulty passing urine, or an aggravation of nerve symptoms. Discuss these effects with your doctor or health care professional, some of them may improve with continued use of this medicine. Female patients may experience a menstrual cycle or spotting during the first months of therapy with this medicine. If this  continues, contact your doctor or health care professional. What side effects may I notice from receiving this medicine? Side effects that you should report to your doctor or health care professional as soon as possible: -allergic reactions like skin rash, itching or hives, swelling of the face, lips, or tongue -breathing problems -chest pain -depression or memory disorders -pain in your legs or groin -pain at site where injected or implanted -seizures -severe headache -swelling of the feet and legs -suicidal thoughts or other mood changes -visual changes -vomiting Side effects that usually do not require medical attention (report to your doctor or health care professional if they continue or are bothersome): -breast swelling or tenderness -decrease in sex drive or performance -diarrhea -hot flashes -loss of appetite -muscle, joint, or bone pains -nausea -redness or irritation at site where injected or implanted -skin problems or acne This list may not describe all possible side effects. Call your doctor for medical advice about side effects. You may report side effects to FDA at 1-800-FDA-1088. Where should I keep my medicine? This drug is given in a hospital or clinic and will not be stored at home. NOTE: This sheet is a summary. It may not cover all possible information. If you have questions about this medicine, talk to your doctor, pharmacist, or health care provider.  2018 Elsevier/Gold Standard (2015-09-01 09:45:53)

## 2017-06-11 NOTE — Progress Notes (Signed)
Subjective:     Amy [name] is a 47 y.o. female P4 with BMI 36 who is here for a comprehensive physical exam. The patient reports heavy and painful periods. She states being diagnosis with endometriosis in her teens and again in 2008 via laparoscopy (both times). She states during the second laparoscopy in 2008, she was told that there was a lot of adhesions involving the bowel and her ovary. She describes a monthly period lasting 7 days with the first 2 days being heavy in flow with passage of clots. She is taking iron supplements secondary to associated anemia. She also reports increased pelvic pain with her cycle with left side worst than the right. She also reports LLQ pain with intercourse and some occasional postcoital vaginal bleeding. She denies any abnormal discharge. She is using BTL for contraception.  Past Medical History:  Diagnosis Date  . Endometriosis    Past Surgical History:  Procedure Laterality Date  . TUBAL LIGATION     Family History  Problem Relation Age of Onset  . Hypertension Mother   . COPD Father   . Narcolepsy Brother   . Lung cancer Maternal Grandmother   . Heart failure Paternal Grandfather     Social History   Socioeconomic History  . Marital status: Divorced    Spouse name: Not on file  . Number of children: Not on file  . Years of education: Not on file  . Highest education level: Not on file  Social Needs  . Financial resource strain: Not on file  . Food insecurity - worry: Not on file  . Food insecurity - inability: Not on file  . Transportation needs - medical: Not on file  . Transportation needs - non-medical: Not on file  Occupational History  . Not on file  Tobacco Use  . Smoking status: Never Smoker  Substance and Sexual Activity  . Alcohol use: No  . Drug use: No  . Sexual activity: Yes    Birth control/protection: Surgical  Other Topics Concern  . Not on file  Social History Narrative  . Not on file   Health Maintenance   Topic Date Due  . HIV Screening  08/02/1987  . TETANUS/TDAP  08/02/1991  . PAP SMEAR  08/01/1993  . INFLUENZA VACCINE  10/31/2016       Review of Systems Pertinent items are noted in HPI.   Objective:  Blood pressure (!) 142/87, pulse 82, height 5\' 3"  (1.6 m), weight 204 lb (92.5 kg), last menstrual period 05/26/2017.     GENERAL: Well-developed, well-nourished female in no acute distress.  HEENT: Normocephalic, atraumatic. Sclerae anicteric.  NECK: Supple. Normal thyroid.  LUNGS: Clear to auscultation bilaterally.  HEART: Regular rate and rhythm. BREASTS: Symmetric in size. No palpable masses or lymphadenopathy, skin changes, or nipple drainage. ABDOMEN: Soft, nontender, nondistended. No organomegaly. PELVIC: Normal external female genitalia. Vagina is pink and rugated.  Normal discharge. Normal appearing cervix. Uterus is normal in size. No adnexal mass or tenderness. EXTREMITIES: No cyanosis, clubbing, or edema, 2+ distal pulses.    Assessment:    Healthy female exam.      Plan:    Pap smear collected with cultures ordered Screening mammogram ordered Pelvic ultrasound ordered Discussed treatment plan for endometriosis typically involves hormonal medication such as depo-provera, Mirena IUD or progesterone only pills given that she has CHTN. Also discussed depo-lupron as a treatment options. Explained to the patient that this medical management will also improve her heavy periods (assuming that  pelvic ultrasound is normal). Patient will reviewed her options and contact us with her decission Records requested from Ob-GYN Patient will be contacted with abnormal results  See After Visit Summary for Counseling Recommendations

## 2017-06-11 NOTE — Progress Notes (Signed)
Pt c/o heavy periods, pelvic pain and bleeding after intercourse. Last pap smear March 2018-normal results

## 2017-06-12 ENCOUNTER — Encounter: Payer: Self-pay | Admitting: Obstetrics and Gynecology

## 2017-06-12 LAB — CERVICOVAGINAL ANCILLARY ONLY
Bacterial vaginitis: NEGATIVE
CHLAMYDIA, DNA PROBE: NEGATIVE
Candida vaginitis: NEGATIVE
NEISSERIA GONORRHEA: NEGATIVE

## 2017-06-13 ENCOUNTER — Other Ambulatory Visit: Payer: Self-pay | Admitting: Obstetrics and Gynecology

## 2017-06-13 ENCOUNTER — Ambulatory Visit (INDEPENDENT_AMBULATORY_CARE_PROVIDER_SITE_OTHER): Payer: BLUE CROSS/BLUE SHIELD

## 2017-06-13 DIAGNOSIS — Z1231 Encounter for screening mammogram for malignant neoplasm of breast: Secondary | ICD-10-CM | POA: Diagnosis not present

## 2017-06-13 DIAGNOSIS — N938 Other specified abnormal uterine and vaginal bleeding: Secondary | ICD-10-CM | POA: Diagnosis not present

## 2017-06-13 DIAGNOSIS — R102 Pelvic and perineal pain: Secondary | ICD-10-CM

## 2017-06-13 DIAGNOSIS — Z01419 Encounter for gynecological examination (general) (routine) without abnormal findings: Secondary | ICD-10-CM

## 2017-06-13 LAB — CYTOLOGY - PAP
Diagnosis: NEGATIVE
HPV: NOT DETECTED

## 2017-06-14 ENCOUNTER — Encounter: Payer: Self-pay | Admitting: Obstetrics and Gynecology

## 2017-06-17 ENCOUNTER — Telehealth: Payer: Self-pay | Admitting: Obstetrics and Gynecology

## 2017-06-17 MED ORDER — NORETHINDRONE 0.35 MG PO TABS: 1.0000 | ORAL_TABLET | Freq: Every day | ORAL | 11 refills | Status: DC

## 2017-06-17 NOTE — Telephone Encounter (Signed)
Pt called requesting to go ahead and get a RX for Micronor.  Per Dr Deretha Emoryonstant's note she had given her several opts and the patient has chosen the progesterone only pill.  This was sent to [facility].

## 2017-06-17 NOTE — Telephone Encounter (Signed)
Pt called states based on results she would like to start taking Progesterone pill. Pt states she and Dr Elly Modena had discussed options and pt wants to take the pill. Pt uses [facility] [address] [phone]. Pt req ret ph call [phone].  Pt give first available appt 07/09/17.

## 2017-07-09 ENCOUNTER — Ambulatory Visit (INDEPENDENT_AMBULATORY_CARE_PROVIDER_SITE_OTHER): Payer: BLUE CROSS/BLUE SHIELD | Admitting: Obstetrics and Gynecology

## 2017-07-09 ENCOUNTER — Encounter: Payer: Self-pay | Admitting: Obstetrics and Gynecology

## 2017-07-09 VITALS — BP 139/85 | HR 94 | Resp 16 | Ht 63.0 in | Wt 202.0 lb

## 2017-07-09 DIAGNOSIS — N946 Dysmenorrhea, unspecified: Secondary | ICD-10-CM

## 2017-07-09 DIAGNOSIS — Z712 Person consulting for explanation of examination or test findings: Secondary | ICD-10-CM

## 2017-07-09 NOTE — Progress Notes (Signed)
47 yo here to discuss results of pelvic ultrasound. Patient with history of endometriosis currently suffering with dysmenorrhea. Patient is using BTL for contraception  Past Medical History:  Diagnosis Date  . Endometriosis    Past Surgical History:  Procedure Laterality Date  . BREAST BIOPSY Left   . BREAST EXCISIONAL BIOPSY Left   . TUBAL LIGATION     Family History  Problem Relation Age of Onset  . Hypertension Mother   . COPD Father   . Narcolepsy Brother   . Lung cancer Maternal Grandmother   . Heart failure Paternal Grandfather    Social History   Tobacco Use  . Smoking status: Never Smoker  . Smokeless tobacco: Never Used  Substance Use Topics  . Alcohol use: No  . Drug use: No   ROS See pertinent in HPI Blood pressure 139/85, pulse 94, resp. rate 16, height 5\' 3"  (1.6 m), weight 202 lb (91.6 kg), last menstrual period 06/17/2017.   GENERAL: Well-developed, well-nourished female in no acute distress.  NEURO: alert and oriented x 3  Pelvic ultrasound FINDINGS: Uterus  Measurements: 10.6 x 6.1 x 5.8 cm. No fibroids or other mass visualized.  Endometrium  Thickness: 12 mm.  No focal abnormality visualized.  Right ovary  Measurements: 3.4 x 2.8 x 3.7 cm. 3 cm follicular cyst noted. Normal appearance/no adnexal mass.  Left ovary  Measurements: 3.6 x 2.8 x 2.4 cm. Normal appearance/no adnexal mass.  Other findings  No abnormal free fluid.  IMPRESSION: Negative. No pelvic mass or other significant abnormality identified.   Electronically Signed   By: Myles RosenthalJohn  Stahl M.D.   On: 06/13/2017 15:20  A/P 47 yo with laparoscopic proven diagnosis of endometriosis with dysmenorrhea - Ultrasound report reviewed with the patient - Discussed medical management and patient opted for POP. Patient has not started birth control yet but plans to do so at the start of her next period - patient to contact us after 3 months if persistent issues to discuss  other options - RTC prn

## 2017-08-28 ENCOUNTER — Encounter: Payer: Self-pay | Admitting: Obstetrics and Gynecology

## 2017-08-30 ENCOUNTER — Other Ambulatory Visit: Payer: Self-pay | Admitting: Obstetrics and Gynecology

## 2017-08-30 MED ORDER — MEGESTROL ACETATE 40 MG PO TABS: 40.0000 mg | ORAL_TABLET | Freq: Two times a day (BID) | ORAL | 5 refills | Status: DC

## 2018-01-04 ENCOUNTER — Other Ambulatory Visit: Payer: Self-pay

## 2018-01-04 ENCOUNTER — Encounter (HOSPITAL_BASED_OUTPATIENT_CLINIC_OR_DEPARTMENT_OTHER): Payer: Self-pay | Admitting: *Deleted

## 2018-01-04 ENCOUNTER — Emergency Department (HOSPITAL_BASED_OUTPATIENT_CLINIC_OR_DEPARTMENT_OTHER): Payer: BLUE CROSS/BLUE SHIELD

## 2018-01-04 ENCOUNTER — Emergency Department (HOSPITAL_BASED_OUTPATIENT_CLINIC_OR_DEPARTMENT_OTHER)
Admission: EM | Admit: 2018-01-04 | Discharge: 2018-01-05 | Disposition: A | Discharge status: Home / Self Care | Payer: BLUE CROSS/BLUE SHIELD | Attending: Emergency Medicine | Admitting: Emergency Medicine

## 2018-01-04 DIAGNOSIS — Z79899 Other long term (current) drug therapy: Secondary | ICD-10-CM | POA: Insufficient documentation

## 2018-01-04 DIAGNOSIS — R0981 Nasal congestion: Secondary | ICD-10-CM | POA: Insufficient documentation

## 2018-01-04 DIAGNOSIS — I10 Essential (primary) hypertension: Secondary | ICD-10-CM | POA: Diagnosis not present

## 2018-01-04 DIAGNOSIS — Z91040 Latex allergy status: Secondary | ICD-10-CM | POA: Insufficient documentation

## 2018-01-04 DIAGNOSIS — R1013 Epigastric pain: Secondary | ICD-10-CM | POA: Insufficient documentation

## 2018-01-04 DIAGNOSIS — R197 Diarrhea, unspecified: Secondary | ICD-10-CM | POA: Insufficient documentation

## 2018-01-04 DIAGNOSIS — R110 Nausea: Secondary | ICD-10-CM | POA: Diagnosis not present

## 2018-01-04 DIAGNOSIS — Z8673 Personal history of transient ischemic attack (TIA), and cerebral infarction without residual deficits: Secondary | ICD-10-CM | POA: Diagnosis not present

## 2018-01-04 HISTORY — DX: Cerebral infarction, unspecified: I63.9

## 2018-01-04 HISTORY — DX: Essential (primary) hypertension: I10

## 2018-01-04 HISTORY — DX: Transient cerebral ischemic attack, unspecified: G45.9

## 2018-01-04 LAB — TROPONIN I

## 2018-01-04 LAB — PREGNANCY, URINE: PREG TEST UR: NEGATIVE

## 2018-01-04 MED ORDER — ACETAMINOPHEN 325 MG PO TABS
650.0000 mg | ORAL_TABLET | Freq: Once | ORAL | Status: AC
  Administered 2018-01-04: 650 mg via ORAL
  Filled 2018-01-04: qty 2

## 2018-01-04 MED ORDER — NITROGLYCERIN 0.4 MG SL SUBL
0.4000 mg | SUBLINGUAL_TABLET | SUBLINGUAL | Status: DC | PRN
  Administered 2018-01-04: 0.4 mg via SUBLINGUAL
  Filled 2018-01-04: qty 1

## 2018-01-04 NOTE — ED Notes (Signed)
ED Provider at bedside. 

## 2018-01-04 NOTE — Discharge Instructions (Signed)
You can always go back to your normal dose of metoprolol and continue the double dose of Benicar.  Make sure you are not taking any over-the-counter medications that have pseudoephedrine, caffeine or other extra additives

## 2018-01-04 NOTE — ED Notes (Signed)
After 1st dose of nitro, pt called out to report increased HA. Pt denies chest pain at this time. No further doses of nitro administered at this time. EDP informed.

## 2018-01-04 NOTE — ED Triage Notes (Signed)
Pt reports feeling bad since Thursday. She saw her PCP yesterday and her BP meds were increased. Today she c/o nausea and "heartburn" and states her BP has still been elevated 175/105

## 2018-01-04 NOTE — ED Provider Notes (Addendum)
[facility] EMERGENCY DEPARTMENT Provider Note   CSN: [id number] Arrival date & time: 01/04/18  1913     History   Chief Complaint Chief Complaint  Patient presents with  . Hypertension    HPI Amy [name] is a 47 y.o. female.  Patient is a 47 year old female with a history of hypertension, stroke, dysmenorrhea who is currently on progesterone presenting today with persistent hypertension despite taking new doses of her blood pressure medication since yesterday and feeling generally terrible.  Patient states this week she has not felt well.  On Monday she started having worsening nasal congestion, popping in her ears, headaches and vertigo.  She has been taking over-the-counter Benadryl congestion but is also been checking her blood pressure and it is been elevated.  She spoke with her doctor who told her to double her Benicar which she did and she followed up yesterday in the office.  There she was also noted to be hypertensive.  He told her to continue taking 40 mg of Benicar but then also had her double the metoprolol.  She started doing that on Friday.  Today she states she felt even worse.  She had headache, discomfort in her chest that she describes as indigestion which was in the epigastric area as well as in the lateral left breast area.  She states for the last 48 hours she has had pretty consistent pain in this area that she describes as a burning dull pain.  It does not radiate anywhere.  Today she was also having some nausea and had one episode of diarrhea.  After the episode of diarrhea she broke out into a cold sweat and when she checked her pulse was 50.  She started to become very worried as her blood pressure was still elevated and she came here for further evaluation.  Patient does have a history of chest pain in the past and states she is even been on a nitroglycerin drip in the past.  She has had borderline CHF but never had an MI.  Other than Benadryl congestion  she is also been taking Goody's powders.  She denies any new cough, fever, urinary symptoms.  The history is provided by the patient.  Hypertension  This is a recurrent problem.    Past Medical History:  Diagnosis Date  . Endometriosis   . Hypertension   . Stroke (HCC)   . TIA (transient ischemic attack)     There are no active problems to display for this patient.   Past Surgical History:  Procedure Laterality Date  . BREAST BIOPSY Left   . BREAST EXCISIONAL BIOPSY Left   . TUBAL LIGATION       OB History    Gravida  5   Para  4   Term  2   Preterm  2   AB  1   Living  4     SAB  1   TAB      Ectopic      Multiple      Live Births               Home Medications    Prior to Admission medications   Medication Sig Start Date End Date Taking? Authorizing Provider  albuterol (PROAIR HFA) 108 (90 Base) MCG/ACT inhaler Inhale 1 puff into the lungs daily as needed. 04/13/14   [provider]  Fluticasone Furoate-Vilanterol (BREO ELLIPTA IN) Inhale 1 puff into the lungs daily.  [provider]  IRON PO Take by mouth.    [provider]  levocetirizine (XYZAL) 2.5 MG/5ML solution Take 2.5 mg by mouth every evening.    [provider]  megestrol (MEGACE) 40 MG tablet Take 1 tablet (40 mg total) by mouth 2 (two) times daily. Can increase to two tablets twice a day in the event of heavy bleeding 08/30/17   Constant, Peggy, MD  metoprolol succinate (TOPROL-XL) 25 MG 24 hr tablet  06/26/17   [provider]  norethindrone (MICRONOR,CAMILA,ERRIN) 0.35 MG tablet Take 1 tablet (0.35 mg total) by mouth daily. Patient not taking: Reported on 07/09/2017 06/17/17   Constant, Peggy, MD  Vitamin D, Ergocalciferol, (DRISDOL) 50000 units CAPS capsule TAKE ONE CAPSULE BY MOUTH ONCE WEEKLY 05/24/17   [provider]    Family History Family History  Problem Relation Age of Onset  . Hypertension Mother   . COPD Father     . Narcolepsy Brother   . Lung cancer Maternal Grandmother   . Heart failure Paternal Grandfather     Social History Social History   Tobacco Use  . Smoking status: Never Smoker  . Smokeless tobacco: Never Used  Substance Use Topics  . Alcohol use: No  . Drug use: No     Allergies   Amlodipine; Amoxicillin; Latex; Codeine; Hydrochlorothiazide; Iodine; and Shellfish allergy   Review of Systems Review of Systems  All other systems reviewed and are negative.    Physical Exam Updated Vital Signs BP (!) 160/95   Pulse 78   Temp 98.4 F (36.9 C) (Oral)   Resp 15   Ht 5\' 4"  (1.626 m)   Wt 92.5 kg   SpO2 98%   BMI 35.02 kg/m   Physical Exam  Constitutional: She is oriented to person, place, and time. She appears well-developed and well-nourished. No distress.  HENT:  Head: Normocephalic and atraumatic.  Eyes: Pupils are equal, round, and reactive to light. EOM are normal.  Cardiovascular: Normal rate, regular rhythm, normal heart sounds and intact distal pulses. Exam reveals no friction rub.  No murmur heard. Pulmonary/Chest: Effort normal and breath sounds normal. She has no wheezes. She has no rales.  Abdominal: Soft. Bowel sounds are normal. She exhibits no distension. There is no tenderness. There is no rebound and no guarding.  Musculoskeletal: Normal range of motion. She exhibits no tenderness.  No edema  Neurological: She is alert and oriented to person, place, and time. No cranial nerve deficit.  Skin: Skin is warm and dry. No rash noted.  Psychiatric: She has a normal mood and affect. Her behavior is normal.  Nursing note and vitals reviewed.    ED Treatments / Results  Labs (all labs ordered are listed, but only abnormal results are displayed) Labs Reviewed  TROPONIN I  PREGNANCY, URINE  TROPONIN I    EKG EKG Interpretation  Date/Time:  Saturday January 04 2018 19:38:44 EDT Ventricular Rate:  66 PR Interval:    QRS Duration: 88 QT  Interval:  385 QTC Calculation: 404 R Axis:   44 Text Interpretation:  Sinus rhythm new Borderline T abnormalities, inferior leads since 07/12/2015 Confirmed by Gwyneth Sprout ([id number]) on 01/04/2018 7:43:40 PM  EKG Interpretation  Date/Time:  Saturday January 04 2018 22:53:54 EDT Ventricular Rate:  70 PR Interval:    QRS Duration: 92 QT Interval:  399 QTC Calculation: 431 R Axis:   41 Text Interpretation:  Sinus rhythm Borderline T abnormalities, diffuse leads No significant change since last  tracing Confirmed by Gwyneth Sprout ([id number]) on 01/04/2018 10:56:50 PM   Radiology Dg Chest 2 View  Result Date: 01/04/2018 CLINICAL DATA:  Cough with nasal congestion for 2 days, headache, history bronchitis, asthma, hypertension EXAM: CHEST - 2 VIEW COMPARISON:  07/12/2015 FINDINGS: Normal heart size, mediastinal contours, and pulmonary vascularity. Lungs clear. No pleural effusion or pneumothorax. Bones unremarkable. IMPRESSION: Normal exam. Electronically Signed   By: Ulyses Southward M.D.   On: 01/04/2018 20:12    Procedures Procedures (including critical care time)  Medications Ordered in ED Medications  nitroGLYCERIN (NITROSTAT) SL tablet 0.4 mg (0.4 mg Sublingual Given 01/04/18 2006)  acetaminophen (TYLENOL) tablet 650 mg (650 mg Oral Given 01/04/18 2037)     Initial Impression / Assessment and Plan / ED Course  I have reviewed the triage vital signs and the nursing notes.  Pertinent labs & imaging results that were available during my care of the patient were reviewed by me and considered in my medical decision making (see chart for details).     Patient presenting today with multiple vague symptoms of generally not feeling well having an episode of loose stool today, nausea and feeling like she might pass out.  This is all in the setting of in the last week also having worsening sinus symptoms and noticing persistent elevated blood pressure.  She recently increased her blood pressure  medications yesterday and was concerned that this may be the cause of her symptoms.  Patient's blood pressure here was elevated at 160/95.  Heart rates have been slowly in the 60s.  EKG here showed new nonspecific T wave inversions inferiorly however patient sees a doctor at [facility] and an EKG done in May of this year showed nonspecific T wave changes as well.  So EKG is unchanged.  She has no abdominal pain concerning for pancreatitis, cholecystitis or other acute abdominal pathology.  Patient had a CBC and CMP done yesterday at her PCPs office with a hemoglobin of 12 and a creatinine just slightly above her baseline at 1.  Electrolytes were normal.  White blood cell count was normal.  Patient's initial troponin here is normal.  Patient was given 1 nitroglycerin and she states that the vague burning discomfort in the left part of her breast is still present but she does feel much better.  Blood pressure is been in the 150s over 70s now for the last hour.  We will repeat an EKG and troponin.  However if these remain stable feel that it is reasonable to allow patient to go home and continue the elevated doses of medication.  She is feeling that she may be symptomatic from the heart rate dropped from the metoprolol.  Recommended that she could always go back on her normal dose of metoprolol and continue her increased dose of Benicar.  Also discussed decreasing the amount of salt in her diet and making sure she is staying well-hydrated.  Also recommended not using over-the-counter products and not using Goody's powders.  11:36 PM Delta trop wnl and repeat EKG unchanged.  Will d/c home.  Last BP is 139/78  Final Clinical Impressions(s) / ED Diagnoses   Final diagnoses:  Hypertension, unspecified type    ED Discharge Orders    None       Gwyneth Sprout, MD 01/04/18 [id number]    Gwyneth Sprout, MD 01/04/18 2337

## 2018-02-18 ENCOUNTER — Emergency Department (INDEPENDENT_AMBULATORY_CARE_PROVIDER_SITE_OTHER)
Admission: EM | Admit: 2018-02-18 | Discharge: 2018-02-18 | Disposition: A | Discharge status: Home / Self Care | Payer: BLUE CROSS/BLUE SHIELD | Source: Home / Self Care

## 2018-02-18 ENCOUNTER — Other Ambulatory Visit: Payer: Self-pay

## 2018-02-18 ENCOUNTER — Emergency Department (INDEPENDENT_AMBULATORY_CARE_PROVIDER_SITE_OTHER): Payer: BLUE CROSS/BLUE SHIELD

## 2018-02-18 DIAGNOSIS — M25531 Pain in right wrist: Secondary | ICD-10-CM

## 2018-02-18 DIAGNOSIS — M25561 Pain in right knee: Secondary | ICD-10-CM

## 2018-02-18 DIAGNOSIS — M25572 Pain in left ankle and joints of left foot: Secondary | ICD-10-CM | POA: Diagnosis not present

## 2018-02-18 DIAGNOSIS — S80211A Abrasion, right knee, initial encounter: Secondary | ICD-10-CM

## 2018-02-18 DIAGNOSIS — M7989 Other specified soft tissue disorders: Secondary | ICD-10-CM | POA: Diagnosis not present

## 2018-02-18 DIAGNOSIS — S63501A Unspecified sprain of right wrist, initial encounter: Secondary | ICD-10-CM | POA: Diagnosis not present

## 2018-02-18 DIAGNOSIS — S93402A Sprain of unspecified ligament of left ankle, initial encounter: Secondary | ICD-10-CM

## 2018-02-18 DIAGNOSIS — IMO0001 Reserved for inherently not codable concepts without codable children: Secondary | ICD-10-CM

## 2018-02-18 DIAGNOSIS — S63502A Unspecified sprain of left wrist, initial encounter: Secondary | ICD-10-CM

## 2018-02-18 DIAGNOSIS — S8001XA Contusion of right knee, initial encounter: Secondary | ICD-10-CM

## 2018-02-18 DIAGNOSIS — S46812A Strain of other muscles, fascia and tendons at shoulder and upper arm level, left arm, initial encounter: Secondary | ICD-10-CM

## 2018-02-18 NOTE — ED Triage Notes (Signed)
Pt was at [facility] last night and fell down about 5 steps.  Pain in the left shoulder and forearm, left wrist, right wrist, both knees, left ankle and right shin.

## 2018-02-18 NOTE — Discharge Instructions (Addendum)
Ibuprofen for aching.  Expect 7-10 days for complete recovery

## 2018-02-18 NOTE — ED Provider Notes (Signed)
[facility]    CSN: [id number] Arrival date & time: 02/18/18  [id number]     History   Chief Complaint Chief Complaint  Patient presents with  . Fall    HPI Brion Alimentkeico N Brault is a 47 y.o. female.   [facility] [place] and fell down about 5 steps.  Pain in the left shoulder and forearm, left wrist, right wrist, both knees, left ankle and right patella.  Patient has tenderness over the right patella where there is an abrasion.  Patient has a small amount of swelling and tenderness at the distal radius on the left and the left wrist is sore with dorsiflexion  Left ankle is tender over the lateral malleolus.  There is no tenderness on the foot.  Once down in the x-ray suite, patient noticed that her right wrist was also hurting when she dorsiflexed it.  The pain was at the ulnar styloid.     Past Medical History:  Diagnosis Date  . Endometriosis   . Hypertension   . Stroke (HCC)   . TIA (transient ischemic attack)     There are no active problems to display for this patient.   Past Surgical History:  Procedure Laterality Date  . BREAST BIOPSY Left   . BREAST EXCISIONAL BIOPSY Left   . TUBAL LIGATION      OB History    Gravida  5   Para  4   Term  2   Preterm  2   AB  1   Living  4     SAB  1   TAB      Ectopic      Multiple      Live Births               Home Medications    Prior to Admission medications   Medication Sig Start Date End Date Taking? Authorizing Provider  albuterol (PROAIR HFA) 108 (90 Base) MCG/ACT inhaler Inhale 1 puff into the lungs daily as needed. 04/13/14   [provider]  Fluticasone Furoate-Vilanterol (BREO ELLIPTA IN) Inhale 1 puff into the lungs daily.     [provider]  IRON PO Take by mouth.    [provider]  levocetirizine (XYZAL) 2.5 MG/5ML solution Take 2.5 mg by mouth every evening.    [provider]  megestrol (MEGACE) 40 MG tablet Take  1 tablet (40 mg total) by mouth 2 (two) times daily. Can increase to two tablets twice a day in the event of heavy bleeding 08/30/17   Constant, Peggy, MD  metoprolol succinate (TOPROL-XL) 25 MG 24 hr tablet 50 mg.  06/26/17   [provider]  norethindrone (MICRONOR,CAMILA,ERRIN) 0.35 MG tablet Take 1 tablet (0.35 mg total) by mouth daily. Patient not taking: Reported on 07/09/2017 06/17/17   Constant, Peggy, MD  olmesartan (BENICAR) 20 MG tablet  12/22/17   [provider]  Vitamin D, Ergocalciferol, (DRISDOL) 50000 units CAPS capsule TAKE ONE CAPSULE BY MOUTH ONCE WEEKLY 05/24/17   [provider]    Family History Family History  Problem Relation Age of Onset  . Hypertension Mother   . COPD Father   . Narcolepsy Brother   . Lung cancer Maternal Grandmother   . Heart failure Paternal Grandfather     Social History Social History   Tobacco Use  . Smoking status: Never Smoker  . Smokeless tobacco: Never Used  Substance Use Topics  . Alcohol use: No  .  Drug use: No     Allergies   Amlodipine; Amoxicillin; Latex; Codeine; Hydrochlorothiazide; Iodine; and Shellfish allergy   Review of Systems Review of Systems  Constitutional: Negative.   HENT: Negative.   Cardiovascular: Negative for chest pain.  Gastrointestinal: Negative.   Genitourinary: Negative.   Musculoskeletal: Positive for arthralgias, gait problem, joint swelling and myalgias.  Neurological: Negative for dizziness, syncope, weakness and headaches.  Hematological: Does not bruise/bleed easily.  Psychiatric/Behavioral: Negative.   All other systems reviewed and are negative.    Physical Exam Triage Vital Signs ED Triage Vitals  Enc Vitals Group     BP 02/18/18 0908 102/68     Pulse Rate 02/18/18 0908 88     Resp 02/18/18 0908 20     Temp 02/18/18 0908 98 F (36.7 C)     Temp Source 02/18/18 0908 Oral     SpO2 02/18/18 0908 99 %     Weight 02/18/18 0909 200 lb (90.7 kg)     Height  02/18/18 0909 5\' 3"  (1.6 m)     Head Circumference --      Peak Flow --      Pain Score 02/18/18 0908 6     Pain Loc --      Pain Edu? --      Excl. in GC? --    No data found.  Updated Vital Signs BP 102/68 (BP Location: Right Arm)   Pulse 88   Temp 98 F (36.7 C) (Oral)   Resp 20   Ht 5\' 3"  (1.6 m)   Wt 90.7 kg   SpO2 99%   BMI 35.43 kg/m    Physical Exam  Constitutional: She is oriented to person, place, and time. She appears well-developed and well-nourished.  HENT:  Head: Normocephalic and atraumatic.  Right Ear: External ear normal.  Left Ear: External ear normal.  Mouth/Throat: Oropharynx is clear and moist.  Eyes: Conjunctivae are normal.  Neck: Normal range of motion.  Pulmonary/Chest: Effort normal.  Musculoskeletal: Normal range of motion. She exhibits tenderness. She exhibits no edema or deformity.   Patient has tenderness over the right patella where there is an abrasion.  Patient has a small amount of swelling and tenderness at the distal radius on the left and the left wrist is sore with dorsiflexion  Left ankle is tender over the lateral malleolus.  There is no tenderness on the foot.  Patient is mildly tender in the trapezius area.  Neer's test and Leanord Asal tests are negative  Mild tenderness and swelling was present at the right ulnar styloid at the wrist.  Neurological: She is alert and oriented to person, place, and time. No cranial nerve deficit or sensory deficit. She exhibits normal muscle tone. Coordination normal.  Skin: Skin is warm.  Abrasions on right shin and right patella with some ecchymosis over the shin  Psychiatric: She has a normal mood and affect. Her behavior is normal. Thought content normal.  Nursing note and vitals reviewed.    UC Treatments / Results  Labs (all labs ordered are listed, but only abnormal results are displayed) Labs Reviewed - No data to display  EKG None  Radiology Dg Wrist Complete  Left  Result Date: 02/18/2018 CLINICAL DATA:  Fall last night. Distal radius/wrist pain with swelling. Initial encounter. EXAM: LEFT WRIST - COMPLETE 3+ VIEW COMPARISON:  None. FINDINGS: There is no evidence of fracture or dislocation. There is no evidence of arthropathy or other focal bone abnormality. Soft tissues are  unremarkable. IMPRESSION: Negative. Electronically Signed   By: Sebastian Ache M.D.   On: 02/18/2018 10:03   Dg Wrist Complete Right  Result Date: 02/18/2018 CLINICAL DATA:  Larey Seat last night with pain and swelling of the wrist EXAM: RIGHT WRIST - COMPLETE 3+ VIEW COMPARISON:  None. FINDINGS: The right radiocarpal joint space is unremarkable and the ulnar styloid is intact. The carpal bones are normal position with normal alignment. No acute abnormality is seen. IMPRESSION: Negative. Electronically Signed   By: Dwyane Dee M.D.   On: 02/18/2018 10:05   Dg Ankle Complete Left  Result Date: 02/18/2018 CLINICAL DATA:  Fall last night with left lateral ankle pain. Initial encounter. EXAM: LEFT ANKLE COMPLETE - 3+ VIEW COMPARISON:  None. FINDINGS: There is no evidence of acute fracture or dislocation. Mild soft tissue swelling is noted anteriorly and laterally at the ankle. Joint space widths are preserved. IMPRESSION: Soft tissue swelling without evidence of acute osseous abnormality. Electronically Signed   By: Sebastian Ache M.D.   On: 02/18/2018 10:05   Dg Knee Ap/lat W/sunrise Right  Result Date: 02/18/2018 CLINICAL DATA:  Misstepped last night and fell. Right anterior knee pain with cutaneous abrasion. EXAM: RIGHT KNEE 3 VIEWS COMPARISON:  None. FINDINGS: The bones are subjectively adequately mineralized. There is no acute fracture nor dislocation. The joint spaces are well maintained. There is no significant osteophyte formation. There may be a small suprapatellar effusion. IMPRESSION: There is no acute or significant chronic bony abnormality of the right knee. I cannot exclude a small  suprapatellar effusion. Electronically Signed   By: David  [place] M.D.   On: 02/18/2018 10:05    Procedures Procedures (including critical care time)  Medications Ordered in UC Medications - No data to display  Initial Impression / Assessment and Plan / UC Course  I have reviewed the triage vital signs and the nursing notes.  Pertinent labs & imaging results that were available during my care of the patient were reviewed by me and considered in my medical decision making (see chart for details).    Final Clinical Impressions(s) / UC Diagnoses   Final diagnoses:  None     Discharge Instructions     Ibuprofen for aching.  Expect 7-10 days for complete recovery    ED Prescriptions    None     Controlled Substance Prescriptions [place] Controlled Substance Registry consulted? Not Applicable   Elvina Sidle, MD 02/18/18 1012

## 2019-03-03 ENCOUNTER — Other Ambulatory Visit: Payer: Self-pay

## 2019-03-03 DIAGNOSIS — Z20822 Contact with and (suspected) exposure to covid-19: Secondary | ICD-10-CM

## 2019-03-05 LAB — NOVEL CORONAVIRUS, NAA: SARS-CoV-2, NAA: NOT DETECTED

## 2019-06-22 ENCOUNTER — Emergency Department (HOSPITAL_BASED_OUTPATIENT_CLINIC_OR_DEPARTMENT_OTHER): Payer: Self-pay

## 2019-06-22 ENCOUNTER — Other Ambulatory Visit: Payer: Self-pay

## 2019-06-22 ENCOUNTER — Emergency Department (HOSPITAL_BASED_OUTPATIENT_CLINIC_OR_DEPARTMENT_OTHER)
Admission: EM | Admit: 2019-06-22 | Discharge: 2019-06-22 | Disposition: A | Discharge status: Home / Self Care | Payer: Self-pay | Attending: Emergency Medicine | Admitting: Emergency Medicine

## 2019-06-22 ENCOUNTER — Encounter (HOSPITAL_BASED_OUTPATIENT_CLINIC_OR_DEPARTMENT_OTHER): Payer: Self-pay | Admitting: Emergency Medicine

## 2019-06-22 DIAGNOSIS — Z79899 Other long term (current) drug therapy: Secondary | ICD-10-CM | POA: Insufficient documentation

## 2019-06-22 DIAGNOSIS — Z91040 Latex allergy status: Secondary | ICD-10-CM | POA: Insufficient documentation

## 2019-06-22 DIAGNOSIS — I10 Essential (primary) hypertension: Secondary | ICD-10-CM | POA: Insufficient documentation

## 2019-06-22 DIAGNOSIS — Z0389 Encounter for observation for other suspected diseases and conditions ruled out: Secondary | ICD-10-CM | POA: Insufficient documentation

## 2019-06-22 DIAGNOSIS — R109 Unspecified abdominal pain: Secondary | ICD-10-CM | POA: Insufficient documentation

## 2019-06-22 HISTORY — DX: Sleep apnea, unspecified: G47.30

## 2019-06-22 HISTORY — DX: Anemia, unspecified: D64.9

## 2019-06-22 LAB — URINALYSIS, ROUTINE W REFLEX MICROSCOPIC
Bilirubin Urine: NEGATIVE
Glucose, UA: NEGATIVE mg/dL
Ketones, ur: NEGATIVE mg/dL
Leukocytes,Ua: NEGATIVE
Nitrite: NEGATIVE
Protein, ur: NEGATIVE mg/dL
Specific Gravity, Urine: >1.03 — ABNORMAL HIGH (ref 1.005–1.030)
pH: 6 (ref 5.0–8.0)

## 2019-06-22 LAB — COMPREHENSIVE METABOLIC PANEL
ALT: 22 U/L (ref 0–44)
AST: 25 U/L (ref 15–41)
Albumin: 3.7 g/dL (ref 3.5–5.0)
Alkaline Phosphatase: 49 U/L (ref 38–126)
Anion gap: 9 (ref 5–15)
BUN: 6 mg/dL (ref 6–20)
CO2: 24 mmol/L (ref 22–32)
Calcium: 8.8 mg/dL — ABNORMAL LOW (ref 8.9–10.3)
Chloride: 105 mmol/L (ref 98–111)
Creatinine, Ser: 0.84 mg/dL (ref 0.44–1.00)
GFR calc Af Amer: >60 mL/min (ref >60)
GFR calc non Af Amer: >60 mL/min (ref >60)
Glucose, Bld: 90 mg/dL (ref 70–99)
Potassium: 3.3 mmol/L — ABNORMAL LOW (ref 3.5–5.1)
Sodium: 138 mmol/L (ref 135–145)
Total Bilirubin: 0.3 mg/dL (ref 0.3–1.2)
Total Protein: 7.3 g/dL (ref 6.5–8.1)

## 2019-06-22 LAB — CBC WITH DIFFERENTIAL/PLATELET
Abs Immature Granulocytes: 0.03 10*3/uL (ref 0.00–0.07)
Basophils Absolute: 0 10*3/uL (ref 0.0–0.1)
Basophils Relative: 1 %
Eosinophils Absolute: 0.2 10*3/uL (ref 0.0–0.5)
Eosinophils Relative: 4 %
HCT: 34.7 % — ABNORMAL LOW (ref 36.0–46.0)
Hemoglobin: 10.9 g/dL — ABNORMAL LOW (ref 12.0–15.0)
Immature Granulocytes: 1 %
Lymphocytes Relative: 29 %
Lymphs Abs: 1.7 10*3/uL (ref 0.7–4.0)
MCH: 24.5 pg — ABNORMAL LOW (ref 26.0–34.0)
MCHC: 31.4 g/dL (ref 30.0–36.0)
MCV: 78 fL — ABNORMAL LOW (ref 80.0–100.0)
Monocytes Absolute: 0.3 10*3/uL (ref 0.1–1.0)
Monocytes Relative: 6 %
Neutro Abs: 3.5 10*3/uL (ref 1.7–7.7)
Neutrophils Relative %: 59 %
Platelets: 352 10*3/uL (ref 150–400)
RBC: 4.45 MIL/uL (ref 3.87–5.11)
RDW: 16.5 % — ABNORMAL HIGH (ref 11.5–15.5)
WBC: 5.8 10*3/uL (ref 4.0–10.5)
nRBC: 0 % (ref 0.0–0.2)

## 2019-06-22 LAB — URINALYSIS, MICROSCOPIC (REFLEX)

## 2019-06-22 LAB — PREGNANCY, URINE: Preg Test, Ur: NEGATIVE

## 2019-06-22 NOTE — Discharge Instructions (Addendum)
Recommend follow-up with primary doctor regarding her symptoms today.  Recommend taking extra banana a day for the next week or so and have your primary doctor recheck your potassium.  If you develop worsening pain, fever, vaginal discharge, or other new concerning symptom recommend return to ER for reassessment.

## 2019-06-22 NOTE — ED Notes (Signed)
ED Provider at bedside. 

## 2019-06-22 NOTE — ED Triage Notes (Signed)
Pt having left flank pain - a dull ache -  since 5am.  Sts she thinks she has lost a tampon vaginally yesterday and the string came out this morning.  She has been unable to find the tampon.

## 2019-06-22 NOTE — ED Provider Notes (Signed)
[facility] EMERGENCY DEPARTMENT Provider Note   CSN: [id number] Arrival date & time: 06/22/19  3818     History Chief Complaint  Patient presents with  . Flank Pain    Amy [name] is a 49 y.o. female.  Presents to ER chief complaint flank pain.  This morning noted a dull ache, pain seemed to worsen, then has slowly improved.  Crampy pain, wraps from left flank to left lower side.  States that she is currently on her period, has had a regular amount of bleeding.  Normally gets cramps with.,  Cramps similar to prior periods today.  No vaginal discharge.  Noted string for her tampon fell out but did not see the tampon itself fall out.  Thinks she last placed it yesterday morning.  No fever.  No abdominal pain or vomiting or nausea. HPI     Past Medical History:  Diagnosis Date  . Anemia   . Endometriosis   . Hypertension   . Sleep apnea   . Stroke (HCC)   . TIA (transient ischemic attack)     There are no problems to display for this patient.   Past Surgical History:  Procedure Laterality Date  . ABDOMINAL SURGERY    . BREAST BIOPSY Left   . BREAST EXCISIONAL BIOPSY Left   . TUBAL LIGATION       OB History    Gravida  5   Para  4   Term  2   Preterm  2   AB  1   Living  4     SAB  1   TAB      Ectopic      Multiple      Live Births              Family History  Problem Relation Age of Onset  . Hypertension Mother   . COPD Father   . Narcolepsy Brother   . Lung cancer Maternal Grandmother   . Heart failure Paternal Grandfather     Social History   Tobacco Use  . Smoking status: Never Smoker  . Smokeless tobacco: Never Used  Substance Use Topics  . Alcohol use: No  . Drug use: No    Home Medications Prior to Admission medications   Medication Sig Start Date End Date Taking? Authorizing Provider  albuterol (PROAIR HFA) 108 (90 Base) MCG/ACT inhaler Inhale 1 puff into the lungs daily as needed. 04/13/14   [provider]  Fluticasone Furoate-Vilanterol (BREO ELLIPTA IN) Inhale 1 puff into the lungs daily.     [provider]  IRON PO Take by mouth.    [provider]  levocetirizine (XYZAL) 2.5 MG/5ML solution Take 2.5 mg by mouth every evening.    [provider]  metoprolol succinate (TOPROL-XL) 25 MG 24 hr tablet 50 mg.  06/26/17   [provider]  Vitamin D, Ergocalciferol, (DRISDOL) 50000 units CAPS capsule TAKE ONE CAPSULE BY MOUTH ONCE WEEKLY 05/24/17   [provider]    Allergies    Amlodipine, Amoxicillin, Latex, Codeine, Hydrochlorothiazide, Iodine, and Shellfish allergy  Review of Systems   Review of Systems  Constitutional: Negative for chills and fever.  HENT: Negative for ear pain and sore throat.   Eyes: Negative for pain and visual disturbance.  Respiratory: Negative for cough and shortness of breath.   Cardiovascular: Negative for chest pain and palpitations.  Gastrointestinal: Negative for abdominal pain and vomiting.  Genitourinary: Positive for vaginal  bleeding. Negative for dysuria and hematuria.  Musculoskeletal: Negative for arthralgias and back pain.  Skin: Negative for color change and rash.  Neurological: Negative for seizures and syncope.  All other systems reviewed and are negative.   Physical Exam Updated Vital Signs BP (!) 145/83 (BP Location: Right Arm)   Pulse 86   Temp 98.3 F (36.8 C) (Oral)   Resp 16   Ht 5\' 3"  (1.6 m)   Wt 94.8 kg   LMP 06/18/2019   SpO2 100%   BMI 37.04 kg/m   Physical Exam Vitals and nursing note reviewed.  Constitutional:      General: She is not in acute distress.    Appearance: She is well-developed.  HENT:     Head: Normocephalic and atraumatic.  Eyes:     Conjunctiva/sclera: Conjunctivae normal.  Cardiovascular:     Rate and Rhythm: Normal rate and regular rhythm.     Heart sounds: No murmur.  Pulmonary:     Effort: Pulmonary effort is normal. No respiratory  distress.     Breath sounds: Normal breath sounds.  Abdominal:     Palpations: Abdomen is soft.     Tenderness: There is no abdominal tenderness.  Genitourinary:    Comments: Noted small blood in vaginal vault, no active bleeding noted, no discharge, no foreign body noted on speculum exam On bimanual exam, no foreign body palpated, no adnexal tenderness, no CMT  06/20/2019 chaperone Musculoskeletal:        General: No tenderness or deformity.     Cervical back: Neck supple.     Comments: Mild tenderness to left lower flank  Skin:    General: Skin is warm and dry.     Capillary Refill: Capillary refill takes less than 2 seconds.  Neurological:     General: No focal deficit present.     Mental Status: She is alert and oriented to person, place, and time.     ED Results / Procedures / Treatments   Labs (all labs ordered are listed, but only abnormal results are displayed) Labs Reviewed  CBC WITH DIFFERENTIAL/PLATELET - Abnormal; Notable for the following components:      Result Value   Hemoglobin 10.9 (*)    HCT 34.7 (*)    MCV 78.0 (*)    MCH 24.5 (*)    RDW 16.5 (*)    All other components within normal limits  COMPREHENSIVE METABOLIC PANEL - Abnormal; Notable for the following components:   Potassium 3.3 (*)    Calcium 8.8 (*)    All other components within normal limits  URINALYSIS, ROUTINE W REFLEX MICROSCOPIC - Abnormal; Notable for the following components:   APPearance HAZY (*)    Specific Gravity, Urine >1.030 (*)    Hgb urine dipstick LARGE (*)    All other components within normal limits  URINALYSIS, MICROSCOPIC (REFLEX) - Abnormal; Notable for the following components:   Bacteria, UA MANY (*)    All other components within normal limits  PREGNANCY, URINE    EKG None  Radiology CT Renal Stone Study  Result Date: 06/22/2019 CLINICAL DATA:  Flank pain, kidney stone suspected. LEFT flank pain. Question loss tampon EXAM: CT ABDOMEN AND PELVIS WITHOUT CONTRAST  TECHNIQUE: Multidetector CT imaging of the abdomen and pelvis was performed following the standard protocol without IV contrast. COMPARISON:  CT 07/12/2015 FINDINGS: Lower chest: Lung bases are clear. Hepatobiliary: No focal hepatic lesion.  Gallbladder normal. Pancreas: Pancreas is normal. No ductal dilatation. No pancreatic inflammation. Spleen:  Normal spleen Adrenals/urinary tract: Adrenal glands normal. Small 1 mm nonobstructing RIGHT renal calculus (image 32/2). No ureterolithiasis or obstructive uropathy. Vascular calcification in the deep LEFT pelvis (image 78/2) is not changed from comparison exam. No bladder calculi Stomach/Bowel: Stomach, small bowel, appendix, and cecum are normal. The colon and rectosigmoid colon are normal. Vascular/Lymphatic: Abdominal aorta is normal caliber. There is no retroperitoneal or periportal lymphadenopathy. No pelvic lymphadenopathy. Reproductive: The uterus is normal. There is no gas-filled structure in the vagina to suggest tampon The ovaries are normal. Other: No free fluid the pelvis. Musculoskeletal: No aggressive osseous lesion. IMPRESSION: 1. No explanation for LEFT-sided pain. No ureterolithiasis or obstructive uropathy. 2. No gas-filled structure within the vagina to localize a tampon. 3. Small nonobstructing calculus in the RIGHT kidney. Electronically Signed   By: Suzy Bouchard M.D.   On: 06/22/2019 10:02    Procedures Procedures (including critical care time)  Medications Ordered in ED Medications - No data to display  ED Course  I have reviewed the triage vital signs and the nursing notes.  Pertinent labs & imaging results that were available during my care of the patient were reviewed by me and considered in my medical decision making (see chart for details).  Clinical Course as of Jun 21 1025  Mon Jun 22, 2019  0915 Rod Holler chaperone for pelvic   [RD]    Clinical Course User Index [RD] Lucrezia Starch, MD   MDM Rules/Calculators/A&P                       49 year old lady presenting to ER with 2 complaints.  She is concerned that she may have had retained tampon as well as some pain on her left side.  Labs within normal limits.  No foreign body noted on speculum exam or palpated on bimanual exam.  CT negative for foreign body.  CT negative for urolithiasis.  UA notable for hematuria, also noted some bacteria.  No dysuria or hematuria or frequency, negative nitrite, negative leukocyte, doubt UTI.  Suspect symptoms related to her period or MSK. Remains well appearing, reviewed return precautions, discharged home.     After the discussed management above, the patient was determined to be safe for discharge.  The patient was in agreement with this plan and all questions regarding their care were answered.  ED return precautions were discussed and the patient will return to the ED with any significant worsening of condition.   Final Clinical Impression(s) / ED Diagnoses Final diagnoses:  Left flank pain    Rx / DC Orders ED Discharge Orders    None       Lucrezia Starch, MD 06/22/19 1027
